# Patient Record
Sex: Female | Born: 1995 | Race: Black or African American | Hispanic: No | Marital: Single | Smoking: Never smoker
Health system: Southern US, Community
[De-identification: ages and names within clinical notes are randomized; demographics above are authoritative.]

## PROBLEM LIST (undated history)

## (undated) HISTORY — PX: TUBAL LIGATION: SHX77

---
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
## 2014-02-04 ENCOUNTER — Ambulatory Visit: Payer: Medicaid Other | Admitting: Family Medicine

## 2015-06-22 ENCOUNTER — Observation Stay (HOSPITAL_COMMUNITY)
Admission: EM | Admit: 2015-06-22 | Discharge: 2015-06-23 | Disposition: A | Discharge status: Home / Self Care | Payer: Medicaid Other | Attending: Surgery | Admitting: Surgery

## 2015-06-22 ENCOUNTER — Encounter (HOSPITAL_COMMUNITY)
Admission: EM | Disposition: A | Discharge status: Home / Self Care | Payer: Self-pay | Source: Home / Self Care | Attending: Emergency Medicine

## 2015-06-22 ENCOUNTER — Emergency Department (HOSPITAL_COMMUNITY): Payer: Medicaid Other

## 2015-06-22 ENCOUNTER — Emergency Department (HOSPITAL_COMMUNITY): Payer: Medicaid Other | Admitting: Anesthesiology

## 2015-06-22 ENCOUNTER — Encounter (HOSPITAL_COMMUNITY): Payer: Self-pay | Admitting: *Deleted

## 2015-06-22 DIAGNOSIS — K353 Acute appendicitis with localized peritonitis, without perforation or gangrene: Secondary | ICD-10-CM

## 2015-06-22 DIAGNOSIS — K3580 Unspecified acute appendicitis: Principal | ICD-10-CM | POA: Insufficient documentation

## 2015-06-22 DIAGNOSIS — Z880 Allergy status to penicillin: Secondary | ICD-10-CM | POA: Insufficient documentation

## 2015-06-22 DIAGNOSIS — N739 Female pelvic inflammatory disease, unspecified: Secondary | ICD-10-CM | POA: Insufficient documentation

## 2015-06-22 HISTORY — PX: LAPAROSCOPIC APPENDECTOMY: SHX408

## 2015-06-22 LAB — COMPREHENSIVE METABOLIC PANEL
ALBUMIN: 3.6 g/dL (ref 3.5–5.0)
ALT: 16 U/L (ref 14–54)
AST: 20 U/L (ref 15–41)
Alkaline Phosphatase: 53 U/L (ref 38–126)
Anion gap: 8 (ref 5–15)
BUN: 7 mg/dL (ref 6–20)
CHLORIDE: 106 mmol/L (ref 101–111)
CO2: 25 mmol/L (ref 22–32)
Calcium: 9.3 mg/dL (ref 8.9–10.3)
Creatinine, Ser: 0.9 mg/dL (ref 0.44–1.00)
GFR calc Af Amer: >60 mL/min (ref >60)
GFR calc non Af Amer: >60 mL/min (ref >60)
GLUCOSE: 102 mg/dL — AB (ref 65–99)
POTASSIUM: 4 mmol/L (ref 3.5–5.1)
Sodium: 139 mmol/L (ref 135–145)
Total Bilirubin: 0.4 mg/dL (ref 0.3–1.2)
Total Protein: 7.6 g/dL (ref 6.5–8.1)

## 2015-06-22 LAB — CBC
HEMATOCRIT: 44.6 % (ref 36.0–46.0)
Hemoglobin: 15.1 g/dL — ABNORMAL HIGH (ref 12.0–15.0)
MCH: 30.5 pg (ref 26.0–34.0)
MCHC: 33.9 g/dL (ref 30.0–36.0)
MCV: 90.1 fL (ref 78.0–100.0)
Platelets: 277 10*3/uL (ref 150–400)
RBC: 4.95 MIL/uL (ref 3.87–5.11)
RDW: 12.6 % (ref 11.5–15.5)
WBC: 11.1 10*3/uL — ABNORMAL HIGH (ref 4.0–10.5)

## 2015-06-22 LAB — WET PREP, GENITAL
Clue Cells Wet Prep HPF POC: NONE SEEN
Trich, Wet Prep: NONE SEEN
YEAST WET PREP: NONE SEEN

## 2015-06-22 LAB — URINALYSIS, ROUTINE W REFLEX MICROSCOPIC
BILIRUBIN URINE: NEGATIVE
GLUCOSE, UA: NEGATIVE mg/dL
Ketones, ur: NEGATIVE mg/dL
Nitrite: NEGATIVE
PH: 6.5 (ref 5.0–8.0)
Protein, ur: NEGATIVE mg/dL
Urobilinogen, UA: 1 mg/dL (ref 0.0–1.0)

## 2015-06-22 LAB — URINE MICROSCOPIC-ADD ON

## 2015-06-22 LAB — LIPASE, BLOOD: LIPASE: 21 U/L — AB (ref 22–51)

## 2015-06-22 LAB — HCG, QUANTITATIVE, PREGNANCY: hCG, Beta Chain, Quant, S: 1 m[IU]/mL (ref <5)

## 2015-06-22 SURGERY — APPENDECTOMY, LAPAROSCOPIC
Anesthesia: General

## 2015-06-22 MED ORDER — ZOLPIDEM TARTRATE 5 MG PO TABS: 5.0000 mg | ORAL_TABLET | Freq: Every evening | ORAL | Status: DC | PRN

## 2015-06-22 MED ORDER — BUPIVACAINE-EPINEPHRINE 0.5% -1:200000 IJ SOLN
INTRAMUSCULAR | Status: DC | PRN
  Administered 2015-06-22: 10 mL

## 2015-06-22 MED ORDER — ACETAMINOPHEN 325 MG PO TABS: 650.0000 mg | ORAL_TABLET | Freq: Four times a day (QID) | ORAL | Status: DC | PRN

## 2015-06-22 MED ORDER — CIPROFLOXACIN IN D5W 400 MG/200ML IV SOLN
400.0000 mg | Freq: Once | INTRAVENOUS | Status: AC
  Administered 2015-06-22: 400 mg via INTRAVENOUS
  Filled 2015-06-22: qty 200

## 2015-06-22 MED ORDER — SCOPOLAMINE 1 MG/3DAYS TD PT72
MEDICATED_PATCH | TRANSDERMAL | Status: DC | PRN
  Administered 2015-06-22: 1 via TRANSDERMAL

## 2015-06-22 MED ORDER — MIDAZOLAM HCL 2 MG/2ML IJ SOLN
INTRAMUSCULAR | Status: AC
  Filled 2015-06-22: qty 4

## 2015-06-22 MED ORDER — LIDOCAINE HCL (CARDIAC) 20 MG/ML IV SOLN
INTRAVENOUS | Status: AC
  Filled 2015-06-22: qty 5

## 2015-06-22 MED ORDER — KETOROLAC TROMETHAMINE 30 MG/ML IJ SOLN
30.0000 mg | Freq: Four times a day (QID) | INTRAMUSCULAR | Status: DC
  Administered 2015-06-22 – 2015-06-23 (×2): 30 mg via INTRAVENOUS
  Filled 2015-06-22 (×2): qty 1

## 2015-06-22 MED ORDER — DIPHENHYDRAMINE HCL 12.5 MG/5ML PO ELIX
12.5000 mg | ORAL_SOLUTION | Freq: Four times a day (QID) | ORAL | Status: DC | PRN
  Administered 2015-06-23: 12.5 mg via ORAL
  Filled 2015-06-22: qty 10

## 2015-06-22 MED ORDER — NEOSTIGMINE METHYLSULFATE 10 MG/10ML IV SOLN
INTRAVENOUS | Status: DC | PRN
  Administered 2015-06-22: 2 mg via INTRAVENOUS

## 2015-06-22 MED ORDER — SODIUM CHLORIDE 0.9 % IR SOLN
Status: DC | PRN
  Administered 2015-06-22: 1000 mL

## 2015-06-22 MED ORDER — NEOSTIGMINE METHYLSULFATE 10 MG/10ML IV SOLN
INTRAVENOUS | Status: AC
  Filled 2015-06-22: qty 1

## 2015-06-22 MED ORDER — DIPHENHYDRAMINE HCL 50 MG/ML IJ SOLN
25.0000 mg | Freq: Once | INTRAMUSCULAR | Status: AC
  Administered 2015-06-22: 25 mg via INTRAVENOUS
  Filled 2015-06-22: qty 1

## 2015-06-22 MED ORDER — ENOXAPARIN SODIUM 40 MG/0.4ML ~~LOC~~ SOLN: 40.0000 mg | SUBCUTANEOUS | Status: DC

## 2015-06-22 MED ORDER — ONDANSETRON HCL 4 MG/2ML IJ SOLN: 4.0000 mg | Freq: Four times a day (QID) | INTRAMUSCULAR | Status: DC | PRN

## 2015-06-22 MED ORDER — GLYCOPYRROLATE 0.2 MG/ML IJ SOLN
INTRAMUSCULAR | Status: AC
  Filled 2015-06-22: qty 2

## 2015-06-22 MED ORDER — SIMETHICONE 80 MG PO CHEW: 40.0000 mg | CHEWABLE_TABLET | Freq: Four times a day (QID) | ORAL | Status: DC | PRN

## 2015-06-22 MED ORDER — LIDOCAINE HCL (CARDIAC) 20 MG/ML IV SOLN
INTRAVENOUS | Status: DC | PRN
  Administered 2015-06-22: 50 mg via INTRAVENOUS

## 2015-06-22 MED ORDER — DEXAMETHASONE SODIUM PHOSPHATE 4 MG/ML IJ SOLN
INTRAMUSCULAR | Status: DC | PRN
  Administered 2015-06-22: 4 mg via INTRAVENOUS

## 2015-06-22 MED ORDER — HYDROMORPHONE HCL 1 MG/ML IJ SOLN
INTRAMUSCULAR | Status: AC
  Filled 2015-06-22: qty 1

## 2015-06-22 MED ORDER — MORPHINE SULFATE (PF) 2 MG/ML IV SOLN
2.0000 mg | Freq: Once | INTRAVENOUS | Status: AC
  Administered 2015-06-22: 2 mg via INTRAVENOUS
  Filled 2015-06-22: qty 1

## 2015-06-22 MED ORDER — SUCCINYLCHOLINE CHLORIDE 20 MG/ML IJ SOLN
INTRAMUSCULAR | Status: AC
  Filled 2015-06-22: qty 1

## 2015-06-22 MED ORDER — PROMETHAZINE HCL 25 MG/ML IJ SOLN
6.2500 mg | INTRAMUSCULAR | Status: DC | PRN
Start: 2015-06-22 — End: 2015-06-22

## 2015-06-22 MED ORDER — ACETAMINOPHEN 650 MG RE SUPP: 650.0000 mg | Freq: Four times a day (QID) | RECTAL | Status: DC | PRN

## 2015-06-22 MED ORDER — DEXTROSE-NACL 5-0.9 % IV SOLN
INTRAVENOUS | Status: DC
  Administered 2015-06-22: 23:00:00 via INTRAVENOUS

## 2015-06-22 MED ORDER — ONDANSETRON HCL 4 MG/2ML IJ SOLN
4.0000 mg | Freq: Once | INTRAMUSCULAR | Status: AC
  Administered 2015-06-22: 4 mg via INTRAVENOUS
  Filled 2015-06-22: qty 2

## 2015-06-22 MED ORDER — SCOPOLAMINE 1 MG/3DAYS TD PT72
MEDICATED_PATCH | TRANSDERMAL | Status: AC
  Filled 2015-06-22: qty 1

## 2015-06-22 MED ORDER — CIPROFLOXACIN IN D5W 400 MG/200ML IV SOLN
400.0000 mg | Freq: Two times a day (BID) | INTRAVENOUS | Status: DC
  Filled 2015-06-22 (×2): qty 200

## 2015-06-22 MED ORDER — FENTANYL CITRATE (PF) 250 MCG/5ML IJ SOLN
INTRAMUSCULAR | Status: DC | PRN
  Administered 2015-06-22: 100 ug via INTRAVENOUS
  Administered 2015-06-22: 150 ug via INTRAVENOUS

## 2015-06-22 MED ORDER — PROPOFOL 10 MG/ML IV BOLUS
INTRAVENOUS | Status: DC | PRN
  Administered 2015-06-22: 200 mg via INTRAVENOUS

## 2015-06-22 MED ORDER — CIPROFLOXACIN IN D5W 400 MG/200ML IV SOLN
400.0000 mg | Freq: Two times a day (BID) | INTRAVENOUS | Status: DC
  Administered 2015-06-23: 400 mg via INTRAVENOUS
  Filled 2015-06-22 (×2): qty 200

## 2015-06-22 MED ORDER — HYDRALAZINE HCL 20 MG/ML IJ SOLN: 10.0000 mg | INTRAMUSCULAR | Status: DC | PRN

## 2015-06-22 MED ORDER — HYDROMORPHONE HCL 1 MG/ML IJ SOLN
1.0000 mg | INTRAMUSCULAR | Status: DC | PRN
  Administered 2015-06-22 – 2015-06-23 (×2): 1 mg via INTRAVENOUS
  Filled 2015-06-22 (×2): qty 1

## 2015-06-22 MED ORDER — SUCCINYLCHOLINE CHLORIDE 20 MG/ML IJ SOLN
INTRAMUSCULAR | Status: DC | PRN
  Administered 2015-06-22: 120 mg via INTRAVENOUS

## 2015-06-22 MED ORDER — DIPHENHYDRAMINE HCL 50 MG/ML IJ SOLN: 12.5000 mg | Freq: Four times a day (QID) | INTRAMUSCULAR | Status: DC | PRN

## 2015-06-22 MED ORDER — SODIUM CHLORIDE 0.9 % IV BOLUS (SEPSIS)
1000.0000 mL | Freq: Once | INTRAVENOUS | Status: AC
  Administered 2015-06-22: 1000 mL via INTRAVENOUS

## 2015-06-22 MED ORDER — 0.9 % SODIUM CHLORIDE (POUR BTL) OPTIME
TOPICAL | Status: DC | PRN
  Administered 2015-06-22: 1000 mL

## 2015-06-22 MED ORDER — KETOROLAC TROMETHAMINE 30 MG/ML IJ SOLN: 30.0000 mg | Freq: Four times a day (QID) | INTRAMUSCULAR | Status: DC | PRN

## 2015-06-22 MED ORDER — ROCURONIUM BROMIDE 100 MG/10ML IV SOLN
INTRAVENOUS | Status: DC | PRN
  Administered 2015-06-22: 15 mg via INTRAVENOUS
  Administered 2015-06-22 (×2): 10 mg via INTRAVENOUS

## 2015-06-22 MED ORDER — MORPHINE SULFATE (PF) 4 MG/ML IV SOLN: 4.0000 mg | Freq: Once | INTRAVENOUS | Status: DC

## 2015-06-22 MED ORDER — ROCURONIUM BROMIDE 50 MG/5ML IV SOLN
INTRAVENOUS | Status: AC
  Filled 2015-06-22: qty 1

## 2015-06-22 MED ORDER — DEXAMETHASONE SODIUM PHOSPHATE 4 MG/ML IJ SOLN
INTRAMUSCULAR | Status: AC
  Filled 2015-06-22: qty 1

## 2015-06-22 MED ORDER — ONDANSETRON HCL 4 MG/2ML IJ SOLN
INTRAMUSCULAR | Status: DC | PRN
  Administered 2015-06-22: 4 mg via INTRAVENOUS

## 2015-06-22 MED ORDER — HYDROMORPHONE HCL 1 MG/ML IJ SOLN
0.2500 mg | INTRAMUSCULAR | Status: DC | PRN
  Administered 2015-06-22: 0.5 mg via INTRAVENOUS

## 2015-06-22 MED ORDER — GLYCOPYRROLATE 0.2 MG/ML IJ SOLN
INTRAMUSCULAR | Status: DC | PRN
  Administered 2015-06-22: 0.4 mg via INTRAVENOUS

## 2015-06-22 MED ORDER — PROPOFOL 10 MG/ML IV BOLUS
INTRAVENOUS | Status: AC
  Filled 2015-06-22: qty 20

## 2015-06-22 MED ORDER — BUPIVACAINE-EPINEPHRINE (PF) 0.5% -1:200000 IJ SOLN
INTRAMUSCULAR | Status: AC
  Filled 2015-06-22: qty 30

## 2015-06-22 MED ORDER — HYDROMORPHONE HCL 1 MG/ML IJ SOLN
1.0000 mg | Freq: Once | INTRAMUSCULAR | Status: AC
  Administered 2015-06-22: 1 mg via INTRAVENOUS
  Filled 2015-06-22: qty 1

## 2015-06-22 MED ORDER — LACTATED RINGERS IV SOLN
INTRAVENOUS | Status: DC | PRN
  Administered 2015-06-22 (×2): via INTRAVENOUS

## 2015-06-22 MED ORDER — METRONIDAZOLE IN NACL 5-0.79 MG/ML-% IV SOLN
500.0000 mg | Freq: Three times a day (TID) | INTRAVENOUS | Status: DC
  Administered 2015-06-22 – 2015-06-23 (×2): 500 mg via INTRAVENOUS
  Filled 2015-06-22 (×3): qty 100

## 2015-06-22 MED ORDER — OXYCODONE HCL 5 MG PO TABS
5.0000 mg | ORAL_TABLET | ORAL | Status: DC | PRN
  Administered 2015-06-23: 5 mg via ORAL
  Filled 2015-06-22: qty 1

## 2015-06-22 MED ORDER — MIDAZOLAM HCL 2 MG/2ML IJ SOLN
INTRAMUSCULAR | Status: DC | PRN
  Administered 2015-06-22: 2 mg via INTRAVENOUS

## 2015-06-22 MED ORDER — IOHEXOL 300 MG/ML  SOLN
100.0000 mL | Freq: Once | INTRAMUSCULAR | Status: AC | PRN
  Administered 2015-06-22: 100 mL via INTRAVENOUS

## 2015-06-22 MED ORDER — ONDANSETRON 4 MG PO TBDP: 4.0000 mg | ORAL_TABLET | Freq: Four times a day (QID) | ORAL | Status: DC | PRN

## 2015-06-22 MED ORDER — METRONIDAZOLE IN NACL 5-0.79 MG/ML-% IV SOLN
500.0000 mg | Freq: Once | INTRAVENOUS | Status: AC
  Administered 2015-06-22: 500 mg via INTRAVENOUS
  Filled 2015-06-22 (×2): qty 100

## 2015-06-22 MED ORDER — FENTANYL CITRATE (PF) 250 MCG/5ML IJ SOLN
INTRAMUSCULAR | Status: AC
  Filled 2015-06-22: qty 5

## 2015-06-22 MED ORDER — ONDANSETRON HCL 4 MG/2ML IJ SOLN
INTRAMUSCULAR | Status: AC
  Filled 2015-06-22: qty 2

## 2015-06-22 SURGICAL SUPPLY — 37 items
APPLIER CLIP ROT 10 11.4 M/L (STAPLE)
BLADE SURG ROTATE 9660 (MISCELLANEOUS) IMPLANT
CANISTER SUCTION 2500CC (MISCELLANEOUS) ×3 IMPLANT
CHLORAPREP W/TINT 26ML (MISCELLANEOUS) ×3 IMPLANT
CLIP APPLIE ROT 10 11.4 M/L (STAPLE) IMPLANT
COVER SURGICAL LIGHT HANDLE (MISCELLANEOUS) ×3 IMPLANT
CUTTER FLEX LINEAR 45M (STAPLE) ×3 IMPLANT
DRAPE WARM FLUID 44X44 (DRAPE) ×3 IMPLANT
ELECT REM PT RETURN 9FT ADLT (ELECTROSURGICAL) ×3
ELECTRODE REM PT RTRN 9FT ADLT (ELECTROSURGICAL) ×1 IMPLANT
ENDOLOOP SUT PDS II  0 18 (SUTURE)
ENDOLOOP SUT PDS II 0 18 (SUTURE) IMPLANT
GLOVE BIO SURGEON STRL SZ8 (GLOVE) ×3 IMPLANT
GLOVE BIOGEL PI IND STRL 8 (GLOVE) ×1 IMPLANT
GLOVE BIOGEL PI INDICATOR 8 (GLOVE) ×2
GOWN STRL REUS W/ TWL LRG LVL3 (GOWN DISPOSABLE) ×2 IMPLANT
GOWN STRL REUS W/ TWL XL LVL3 (GOWN DISPOSABLE) ×1 IMPLANT
GOWN STRL REUS W/TWL LRG LVL3 (GOWN DISPOSABLE) ×4
GOWN STRL REUS W/TWL XL LVL3 (GOWN DISPOSABLE) ×2
KIT BASIN OR (CUSTOM PROCEDURE TRAY) ×3 IMPLANT
KIT ROOM TURNOVER OR (KITS) ×3 IMPLANT
LIQUID BAND (GAUZE/BANDAGES/DRESSINGS) ×3 IMPLANT
NS IRRIG 1000ML POUR BTL (IV SOLUTION) ×3 IMPLANT
PAD ARMBOARD 7.5X6 YLW CONV (MISCELLANEOUS) ×6 IMPLANT
POUCH SPECIMEN RETRIEVAL 10MM (ENDOMECHANICALS) ×3 IMPLANT
RELOAD STAPLE TA45 3.5 REG BLU (ENDOMECHANICALS) ×3 IMPLANT
SCALPEL HARMONIC ACE (MISCELLANEOUS) ×3 IMPLANT
SET IRRIG TUBING LAPAROSCOPIC (IRRIGATION / IRRIGATOR) ×3 IMPLANT
SPECIMEN JAR SMALL (MISCELLANEOUS) ×3 IMPLANT
SUT MON AB 4-0 PC3 18 (SUTURE) ×3 IMPLANT
TOWEL OR 17X24 6PK STRL BLUE (TOWEL DISPOSABLE) ×3 IMPLANT
TOWEL OR 17X26 10 PK STRL BLUE (TOWEL DISPOSABLE) ×3 IMPLANT
TRAY FOLEY CATH 16FR SILVER (SET/KITS/TRAYS/PACK) ×3 IMPLANT
TRAY LAPAROSCOPIC MC (CUSTOM PROCEDURE TRAY) ×3 IMPLANT
TROCAR XCEL BLADELESS 5X75MML (TROCAR) ×6 IMPLANT
TROCAR XCEL BLUNT TIP 100MML (ENDOMECHANICALS) ×3 IMPLANT
TUBING INSUFFLATION (TUBING) ×3 IMPLANT

## 2015-06-22 NOTE — Anesthesia Postprocedure Evaluation (Signed)
Anesthesia Post Note  Patient: Tamara Perkins  Procedure(s) Performed: Procedure(s) (LRB): APPENDECTOMY LAPAROSCOPIC (N/A)  Anesthesia type: general  Patient location: PACU  Post pain: Pain level controlled  Post assessment: Patient's Cardiovascular Status Stable  Last Vitals:  Filed Vitals:   06/22/15 1954  BP: 125/61  Pulse: 64  Temp: 36.7 C  Resp: 14    Post vital signs: Reviewed and stable  Level of consciousness: sedated  Complications: No apparent anesthesia complications

## 2015-06-22 NOTE — Anesthesia Procedure Notes (Signed)
Procedure Name: Intubation Date/Time: 06/22/2015 5:02 PM Performed by: Alanda Amass A Pre-anesthesia Checklist: Patient identified, Emergency Drugs available, Suction available, Patient being monitored and Timeout performed Patient Re-evaluated:Patient Re-evaluated prior to inductionOxygen Delivery Method: Circle system utilized Preoxygenation: Pre-oxygenation with 100% oxygen Intubation Type: IV induction, Rapid sequence and Cricoid Pressure applied Laryngoscope Size: Mac and 3 Grade View: Grade I Tube type: Oral Tube size: 7.5 mm Number of attempts: 1 Airway Equipment and Method: Stylet Placement Confirmation: ETT inserted through vocal cords under direct vision,  positive ETCO2 and breath sounds checked- equal and bilateral Secured at: 21 cm Tube secured with: Tape Dental Injury: Teeth and Oropharynx as per pre-operative assessment

## 2015-06-22 NOTE — ED Notes (Addendum)
Pt states pain to R side of abdomen since this am.  Pain increases with palpation.  Also c/o pain with urination.  Pt also c/o recent vaginal bleeding, though she has a morena.

## 2015-06-22 NOTE — ED Provider Notes (Signed)
CSN: [id number]     Arrival date & time 06/22/15  1029 History   First MD Initiated Contact with Patient 06/22/15 1117     Chief Complaint  Patient presents with  . Flank Pain  . Vaginal Pain     (Consider location/radiation/quality/duration/timing/severity/associated sxs/prior Treatment) HPI   Patient is a 19 year old female, G1 P1001, otherwise healthy, who presents to the emergency room with 2 days of right-sided abdominal pain, that is located from her right lower quadrant to right upper quadrant, without radiation, is constant rated 10 out of 10, and pain is exacerbated with breathing, walking, bumped some cars, and specifically worse with urinating.  The pain began in her central low abdomen as a sharp shooting pain, but now is constantly located on the right side. She endorses mild nausea when she arrived in the ER, but denies vomiting, diarrhea, constipation, dyspepsia, acid reflux, fever, chills, sweats, loss of appetite. She is having dysuria, and is having to stop multiple times for urinary changes to pain, but she denies hematuria, urinary frequency-states only piece twice a day, and denies urinary urgency. Over the past week she has had intermittent vaginal bleeding, which is unusual for her because she is in the Mirena for 4 years.  She has one sexual partner, she uses condoms, requests STD testing, but does not have a known exposure.  She denies any vaginal discharge, vaginal pain, vaginal sores or dyspareunia.  She denies any chest pain, shortness of breath, dizziness or syncope    History reviewed. No pertinent past medical history. History reviewed. No pertinent past surgical history. No family history on file. Social History  Substance Use Topics  . Smoking status: Never Smoker   . Smokeless tobacco: None  . Alcohol Use: Yes     Comment: occ   OB History    No data available     Review of Systems  Constitutional: Negative.   HENT: Negative.   Eyes: Negative.    Respiratory: Negative.   Cardiovascular: Negative.   Gastrointestinal: Negative for vomiting, diarrhea, constipation, blood in stool, anal bleeding and rectal pain.  Endocrine: Negative.   Genitourinary: Positive for dysuria and vaginal bleeding. Negative for urgency, frequency, hematuria, flank pain, decreased urine volume, vaginal discharge, enuresis, genital sores, vaginal pain, menstrual problem, pelvic pain and dyspareunia.  Skin: Negative.   Neurological: Negative.   Hematological: Negative.   Psychiatric/Behavioral: Negative.       Allergies  Penicillins  Home Medications   Prior to Admission medications   Medication Sig Start Date End Date Taking? Authorizing Provider  levonorgestrel (MIRENA) 20 MCG/24HR IUD 1 each by Intrauterine route once.   Yes Historical Provider, MD   BP 118/60 mmHg  Pulse 56  Temp(Src) 98.1 F (36.7 C) (Oral)  Resp 16  Ht  (1.676 m)  SpO2 98% Physical Exam  Constitutional: She is oriented to person, place, and time. She appears well-developed and well-nourished. No distress.  Patient is well-developed well-nourished female, appears stated age, nontoxic appearing, appears to be somewhat uncomfortable  HENT:  Head: Normocephalic and atraumatic.  Nose: Nose normal.  Mouth/Throat: Oropharynx is clear and moist. No oropharyngeal exudate.  Eyes: Conjunctivae and EOM are normal. Pupils are equal, round, and reactive to light. Right eye exhibits no discharge. Left eye exhibits no discharge. No scleral icterus.  Neck: Normal range of motion. No JVD present. No tracheal deviation present. No thyromegaly present.  Cardiovascular: Normal rate, regular rhythm, normal heart sounds and intact distal pulses.  Exam reveals  no gallop and no friction rub.   No murmur heard. Pulmonary/Chest: Effort normal and breath sounds normal. No respiratory distress. She has no wheezes. She has no rales. She exhibits no tenderness.  Abdominal: Soft. Normal appearance  and bowel sounds are normal. She exhibits no mass. There is tenderness. There is guarding. There is no rebound and no CVA tenderness.  Abdomen soft, non-distended, bowel sounds 4, tenderness to light palpation in the right upper quadrant to right lower quadrant, mild tenderness to palpation suprapubic, nontender periumbilically, epigastric and left upper and left lower quadrant.  Negative Rovsing, ttp in RUQ, ttp at McBurney's point, no CVA tenderness  Genitourinary: Uterus normal. Uterus is not tender. Cervix exhibits discharge. Cervix exhibits no motion tenderness and no friability. Right adnexum displays no mass, no tenderness and no fullness. Left adnexum displays no mass, no tenderness and no fullness. There is bleeding in the vagina. No signs of injury around the vagina.  External genitalia, presence of 2 healing lesions in pubic hair, no active drainage, tender  Dark bloody discharge from cervix, presence of clotted blood in vaginal vault, strings from IUD visible coming in cervix, no purulent discharge  Musculoskeletal: Normal range of motion. She exhibits no edema or tenderness.  Lymphadenopathy:    She has no cervical adenopathy.  Neurological: She is alert and oriented to person, place, and time. She has normal reflexes. No cranial nerve deficit. She exhibits normal muscle tone. Coordination normal.  Skin: Skin is warm and dry. No rash noted. She is not diaphoretic. No cyanosis or erythema. No pallor. Nails show no clubbing.  Psychiatric: She has a normal mood and affect. Her behavior is normal. Judgment and thought content normal.  Nursing note and vitals reviewed.      ED Course  Procedures (including critical care time) Labs Review Labs Reviewed  WET PREP, GENITAL - Abnormal; Notable for the following:    WBC, Wet Prep HPF POC MODERATE (*)    All other components within normal limits  LIPASE, BLOOD - Abnormal; Notable for the following:    Lipase 21 (*)    All other  components within normal limits  COMPREHENSIVE METABOLIC PANEL - Abnormal; Notable for the following:    Glucose, Bld 102 (*)    All other components within normal limits  CBC - Abnormal; Notable for the following:    WBC 11.1 (*)    Hemoglobin 15.1 (*)    All other components within normal limits  URINALYSIS, ROUTINE W REFLEX MICROSCOPIC (NOT AT [facility]) - Abnormal; Notable for the following:    Specific Gravity, Urine >1.046 (*)    Hgb urine dipstick LARGE (*)    Leukocytes, UA SMALL (*)    All other components within normal limits  URINE MICROSCOPIC-ADD ON - Abnormal; Notable for the following:    Squamous Epithelial / LPF FEW (*)    All other components within normal limits  HCG, QUANTITATIVE, PREGNANCY  RPR  HIV ANTIBODY (ROUTINE TESTING)  GC/CHLAMYDIA PROBE AMP ([place]) NOT AT [facility]    Imaging Review Ct Abdomen Pelvis W Contrast  06/22/2015   CLINICAL DATA:  New right-sided pain mid to lower region around to the back. No nausea, vomiting, diarrhea, or fever. All  EXAM: CT ABDOMEN AND PELVIS WITH CONTRAST  TECHNIQUE: Multidetector CT imaging of the abdomen and pelvis was performed using the standard protocol following bolus administration of intravenous contrast.  CONTRAST:  OMNIPAQUE IOHEXOL 300 MG/ML  SOLN  COMPARISON:  None.  FINDINGS: Lower  chest: No pulmonary nodules, pleural effusions, or infiltrates. Heart size is normal. No imaged pericardial effusion or significant coronary artery calcifications.  Upper abdomen: No focal abnormality identified within the liver, spleen, pancreas, adrenal glands, or kidneys. The gallbladder is present.  Gastrointestinal tract: The stomach and small bowel loops are normal in appearance. There is a small amount of stranding surrounding the appendix suspicious for acute appendicitis. Appendix is upper limits normal in thickness. There is no associated abscess. No perforation. Colonic loops are normal in appearance.  Pelvis: The uterus is  present and contains intrauterine device in the central canal. A left ovarian cyst is 1.9 x 3.4 cm. The right ovary has a normal appearance. Within the right cul-de-sac region there is a focal collection measuring fluid attenuation. This measures 2.4 x 2.5 cm.  Retroperitoneum: No evidence for aortic aneurysm. No retroperitoneal or mesenteric adenopathy.  Abdominal wall: Unremarkable.  Osseous structures: Unremarkable.  IMPRESSION: 1. Small amount of fluid and stranding surrounding the appendix which is upper limits normal in size. Findings are suspicious for a early acute appendicitis. No evidence for perforation or abscess. 2. Left ovarian cyst 3.4 cm. Given the size and benign features, and no specific follow-up is felt to be necessary for this abnormality. 3. Small focal collection of fluid within the right cul-de-sac is 2.4 x 2.5 cm and may be related to inflammation of the appendix. This appears discrete from the ovary.   Electronically Signed   By: Norva Pavlov M.D.   On: 06/22/2015 13:42   I have personally reviewed and evaluated these images and lab results as part of my medical decision-making.   EKG Interpretation None      MDM   Final diagnoses:  None    Pt with right sided abdominal pain from RUQ to RLQ, mild suprapubic tenderness, and no CVA tenderness.  Pt's vaginal exam pertinent for vaginal bleeding, no CMT or adnexal tenderness.  She does have some dysuria symptoms, urine is not yet collected this time.  Her history is somewhat suspicious for acute appendicitis, she states that she had some low central abdominal pain at the onset and now is right sided with guarding, however she is febrile, not tachycardic, has not had any nausea or vomiting or loss of appetite, she states the pain is not worsened or relieved with eating.    Will need to rule out pyelonephritis with urinalysis, rule out acute appendicitis with CT scan  Patient is well-appearing, vital signs are stable and  within normal limits, however exam is pertinent for significant focal, right-sided abdominal pain with some peritoneal signs  CT pertinent for early acute appendicitis, labs pertinent for mild leukocytosis of 11.1, surgery will be called.  Case was reviewed with Dr. Rolland Porter.  Last ate - yesterday Last drank - drinking water when first brought back to ER room at 11:40 Allergic to penicillin  Dr. Luisa Hart was called for acute appendicitis - will be taken to the OR for appendectomy.    Danelle Berry, PA-C 06/22/15 1555  Rolland Porter, MD 07/01/15 [phone]

## 2015-06-22 NOTE — Transfer of Care (Signed)
Immediate Anesthesia Transfer of Care Note  Patient: Tamara Perkins  Procedure(s) Performed: Procedure(s): APPENDECTOMY LAPAROSCOPIC (N/A)  Patient Location: PACU  Anesthesia Type:General  Level of Consciousness: awake  Airway & Oxygen Therapy: Patient Spontanous Breathing and Patient connected to nasal cannula oxygen  Post-op Assessment: Report given to RN and Post -op Vital signs reviewed and stable  Post vital signs: Reviewed and stable  Last Vitals:  Filed Vitals:   06/22/15 1818  BP: 107/59  Pulse: 68  Temp: 37 C  Resp: 11    Complications: No apparent anesthesia complications

## 2015-06-22 NOTE — Op Note (Signed)
Appendectomy, Lap, Procedure Note  Indications: The patient presented with a history of right-sided abdominal pain. A CT revealed findings consistent with acute appendicitis.The procedure has been discussed with the patient.  Alternative therapies have been discussed with the patient.  Operative risks include bleeding,  Infection,  Organ injury,  Nerve injury,  Blood vessel injury,  DVT,  Pulmonary embolism,  Death,  And possible reoperation.  Medical management risks include worsening of present situation.  The success of the procedure is 50 -90 % at treating patients symptoms.  The patient understands and agrees to proceed.  Pre-operative Diagnosis: Acute appendicitis without mention of peritonitis  Post-operative Diagnosis: Same plus PID without abscess  Surgeon: Sabrin Dunlevy A.   Assistants: none   Anesthesia: General endotracheal anesthesia and Local anesthesia 0.25.% bupivacaine, with epinephrine  ASA Class: 1  Procedure Details  The patient was seen again in the Holding Room. The risks, benefits, complications, treatment options, and expected outcomes were discussed with the patient and/or family. The possibilities of reaction to medication, pulmonary aspiration, perforation of viscus, bleeding, recurrent infection, finding a normal appendix, the need for additional procedures, failure to diagnose a condition, and creating a complication requiring transfusion or operation were discussed. There was concurrence with the proposed plan and informed consent was obtained. The site of surgery was properly noted/marked. The patient was taken to Operating Room, identified as [employer] and the procedure verified as Appendectomy. A Time Out was held and the above information confirmed.  The patient was placed in the supine position and general anesthesia was induced, along with placement of orogastric tube, Venodyne boots, and a Foley catheter. The abdomen was prepped and draped in a sterile  fashion. A one centimeter infraumbilical incision was made and the peritoneal cavity was accessed using the OPEN  technique. The pneumoperitoneum was then established to steady pressure of 12 mmHg. A 12 mm port was placed through the umbilical incision. Additional 5 mm cannulas then placed in the left lower quadrant of the abdomen and half way between the umbilicus and pubic symphysis under direct vision. A careful evaluation of the entire abdomen was carried out. The right fallopian tube was swollen and injected near the inflamed appendix.  Th The right ovary was normal.  The left fallopian tube was mildly swollen and right ovary normal.  The uterus was injected.  This is most consistent with mild PID.   The patient was placed in Trendelenburg and left lateral decubitus position. The small intestines were retracted in the cephalad and left lateral direction away from the pelvis and right lower quadrant. The patient was found to have an enlarged and inflamed appendix that was extending into the pelvis. There was no evidence of perforation.  The appendix was carefully dissected. A window was made in the mesoappendix at the base of the appendix. A harmonic scalpel was used across the mesoappendix. The appendix was divided at its base using an endo-GIA stapler. Minimal appendiceal stump was left in place. There was no evidence of bleeding, leakage, or complication after division of the appendix. Irrigation was also performed and irrigate suctioned from the abdomen as well.  The umbilical port site was closed using 0 vicryl pursestring sutures fashion at the level of the fascia. The trocar site skin wounds were closed using skin staples.  Instrument, sponge, and needle counts were correct at the conclusion of the case.   Findings: The appendix was found to be inflamed. There were not signs of necrosis.  There was not  perforation. There was not abscess formation.  Estimated Blood Loss:  Minimal                   Total IV Fluids: 700 mL         Specimens: APPENDIX         Complications:  None; patient tolerated the procedure well.         Disposition: PACU - hemodynamically stable.         Condition: stable

## 2015-06-22 NOTE — H&P (Signed)
Tamara Perkins is an 19 y.o. female.   Chief Complaint: abdominalpain HPI: 2 day history of right sided abdominal pain worsening over the last 24 hours.  Location right lower to mid abdomen.  No N/V and bowel function normal.  CT Shows appendicitis.  Pain is worse with movement and 7/10  History reviewed. No pertinent past medical history.  History reviewed. No pertinent past surgical history.  No family history on file. Social History:  reports that she has never smoked. She does not have any smokeless tobacco history on file. She reports that she drinks alcohol. She reports that she does not use illicit drugs.  Allergies:  Allergies  Allergen Reactions  . Penicillins Other (See Comments)    Childhood reaction     (Not in a hospital admission)  Results for orders placed or performed during the hospital encounter of 06/22/15 (from the past 48 hour(s))  Lipase, blood     Status: Abnormal   Collection Time: 06/22/15 11:21 AM  Result Value Ref Range   Lipase 21 (L) 22 - 51 U/L  Comprehensive metabolic panel     Status: Abnormal   Collection Time: 06/22/15 11:21 AM  Result Value Ref Range   Sodium 139 135 - 145 mmol/L   Potassium 4.0 3.5 - 5.1 mmol/L   Chloride 106 101 - 111 mmol/L   CO2 25 22 - 32 mmol/L   Glucose, Bld 102 (H) 65 - 99 mg/dL   BUN 7 6 - 20 mg/dL   Creatinine, Ser 0.90 0.44 - 1.00 mg/dL   Calcium 9.3 8.9 - 10.3 mg/dL   Total Protein 7.6 6.5 - 8.1 g/dL   Albumin 3.6 3.5 - 5.0 g/dL   AST 20 15 - 41 U/L   ALT 16 14 - 54 U/L   Alkaline Phosphatase 53 38 - 126 U/L   Total Bilirubin 0.4 0.3 - 1.2 mg/dL   GFR calc non Af Amer >60 >60 mL/min   GFR calc Af Amer >60 >60 mL/min    Comment: (NOTE) The eGFR has been calculated using the CKD EPI equation. This calculation has not been validated in all clinical situations. eGFR's persistently <60 mL/min signify possible Chronic Kidney Disease.    Anion gap 8 5 - 15  CBC     Status: Abnormal   Collection Time: 06/22/15  11:21 AM  Result Value Ref Range   WBC 11.1 (H) 4.0 - 10.5 K/uL   RBC 4.95 3.87 - 5.11 MIL/uL   Hemoglobin 15.1 (H) 12.0 - 15.0 g/dL   HCT 44.6 36.0 - 46.0 %   MCV 90.1 78.0 - 100.0 fL   MCH 30.5 26.0 - 34.0 pg   MCHC 33.9 30.0 - 36.0 g/dL   RDW 12.6 11.5 - 15.5 %   Platelets 277 150 - 400 K/uL  hCG, quantitative, pregnancy     Status: None   Collection Time: 06/22/15 11:21 AM  Result Value Ref Range   hCG, Beta Chain, Quant, S 1 <5 mIU/mL    Comment:          GEST. AGE      CONC.  (mIU/mL)   <=1 WEEK        5 - 50     2 WEEKS       50 - 500     3 WEEKS       100 - 10,000     4 WEEKS     1,000 - 30,000     5 WEEKS  3,500 - 115,000   6-8 WEEKS     12,000 - 270,000    12 WEEKS     15,000 - 220,000        FEMALE AND NON-PREGNANT FEMALE:     LESS THAN 5 mIU/mL   Wet prep, genital     Status: Abnormal   Collection Time: 06/22/15 12:11 PM  Result Value Ref Range   Yeast Wet Prep HPF POC NONE SEEN NONE SEEN   Trich, Wet Prep NONE SEEN NONE SEEN   Clue Cells Wet Prep HPF POC NONE SEEN NONE SEEN   WBC, Wet Prep HPF POC MODERATE (A) NONE SEEN  Urinalysis, Routine w reflex microscopic (not at [facility])     Status: Abnormal   Collection Time: 06/22/15  2:26 PM  Result Value Ref Range   Color, Urine YELLOW YELLOW   APPearance CLEAR CLEAR   Specific Gravity, Urine >1.046 (H) 1.005 - 1.030   pH 6.5 5.0 - 8.0   Glucose, UA NEGATIVE NEGATIVE mg/dL   Hgb urine dipstick LARGE (A) NEGATIVE   Bilirubin Urine NEGATIVE NEGATIVE   Ketones, ur NEGATIVE NEGATIVE mg/dL   Protein, ur NEGATIVE NEGATIVE mg/dL   Urobilinogen, UA 1.0 0.0 - 1.0 mg/dL   Nitrite NEGATIVE NEGATIVE   Leukocytes, UA SMALL (A) NEGATIVE  Urine microscopic-add on     Status: Abnormal   Collection Time: 06/22/15  2:26 PM  Result Value Ref Range   Squamous Epithelial / LPF FEW (A) RARE   WBC, UA 11-20 <3 WBC/hpf   RBC / HPF 7-10 <3 RBC/hpf   Bacteria, UA RARE RARE   Ct Abdomen Pelvis W Contrast  06/22/2015   CLINICAL  DATA:  New right-sided pain mid to lower region around to the back. No nausea, vomiting, diarrhea, or fever. All  EXAM: CT ABDOMEN AND PELVIS WITH CONTRAST  TECHNIQUE: Multidetector CT imaging of the abdomen and pelvis was performed using the standard protocol following bolus administration of intravenous contrast.  CONTRAST:  159m OMNIPAQUE IOHEXOL 300 MG/ML  SOLN  COMPARISON:  None.  FINDINGS: Lower chest: No pulmonary nodules, pleural effusions, or infiltrates. Heart size is normal. No imaged pericardial effusion or significant coronary artery calcifications.  Upper abdomen: No focal abnormality identified within the liver, spleen, pancreas, adrenal glands, or kidneys. The gallbladder is present.  Gastrointestinal tract: The stomach and small bowel loops are normal in appearance. There is a small amount of stranding surrounding the appendix suspicious for acute appendicitis. Appendix is upper limits normal in thickness. There is no associated abscess. No perforation. Colonic loops are normal in appearance.  Pelvis: The uterus is present and contains intrauterine device in the central canal. A left ovarian cyst is 1.9 x 3.4 cm. The right ovary has a normal appearance. Within the right cul-de-sac region there is a focal collection measuring fluid attenuation. This measures 2.4 x 2.5 cm.  Retroperitoneum: No evidence for aortic aneurysm. No retroperitoneal or mesenteric adenopathy.  Abdominal wall: Unremarkable.  Osseous structures: Unremarkable.  IMPRESSION: 1. Small amount of fluid and stranding surrounding the appendix which is upper limits normal in size. Findings are suspicious for a early acute appendicitis. No evidence for perforation or abscess. 2. Left ovarian cyst 3.4 cm. Given the size and benign features, and no specific follow-up is felt to be necessary for this abnormality. 3. Small focal collection of fluid within the right cul-de-sac is 2.4 x 2.5 cm and may be related to inflammation of the  appendix. This appears discrete from the  ovary.   Electronically Signed   By: Nolon Nations M.D.   On: 06/22/2015 13:42    Review of Systems  Constitutional: Positive for diaphoresis. Negative for chills.  HENT: Negative.   Eyes: Negative.   Respiratory: Negative.   Cardiovascular: Negative.   Gastrointestinal: Positive for abdominal pain. Negative for heartburn and blood in stool.  Genitourinary: Negative.   Musculoskeletal: Negative.   Skin: Negative.   Neurological: Positive for dizziness.  Psychiatric/Behavioral: Negative.     Blood pressure 118/52, pulse 91, temperature 97.7 F (36.5 C), temperature source Oral, resp. rate 16, height _0  (1.676 m), SpO2 96 %. Physical Exam  Constitutional: She is oriented to person, place, and time. She appears well-developed and well-nourished.  HENT:  Head: Normocephalic and atraumatic.  Eyes: Pupils are equal, round, and reactive to light. No scleral icterus.  Neck: Normal range of motion. Neck supple.  Cardiovascular: Normal rate and regular rhythm.   Respiratory: Effort normal and breath sounds normal.  GI: There is tenderness in the right lower quadrant. There is guarding and tenderness at McBurney's point. There is no rigidity.  Neurological: She is alert and oriented to person, place, and time.  Skin: Skin is warm and dry.  Psychiatric: She has a normal mood and affect. Her behavior is normal. Judgment and thought content normal.     Assessment/Plan Acute appendicitis  Discussed medical and surgical therapy Discussed the pro and cons of each and complications of each Discussed laparoscopic appendectomy  The procedure has been discussed with the patient.  Alternative therapies have been discussed with the patient.  Operative risks include bleeding,  Infection,  Organ injury,  Nerve injury,  Blood vessel injury,  DVT,  Pulmonary embolism,  Death,  And possible reoperation.  Medical management risks include worsening of present  situation.  The success of the procedure is 50 -90 % at treating patients symptoms.  The patient understands and agrees to proceed.  Keyera Hattabaugh A. 06/22/2015, 3:13 PM

## 2015-06-22 NOTE — Anesthesia Preprocedure Evaluation (Addendum)
Anesthesia Evaluation  Patient identified by MRN, date of birth, ID band Patient awake    Reviewed: Allergy & Precautions, H&P , NPO status , Patient's Chart, lab work & pertinent test results  Airway Mallampati: I  TM Distance: >3 FB Neck ROM: Full    Dental  (+) Teeth Intact, Dental Advisory Given   Pulmonary neg pulmonary ROS,    Pulmonary exam normal       Cardiovascular negative cardio ROS Normal cardiovascular exam    Neuro/Psych negative neurological ROS  negative psych ROS   GI/Hepatic negative GI ROS, Neg liver ROS,   Endo/Other  negative endocrine ROS  Renal/GU negative Renal ROS     Musculoskeletal   Abdominal   Peds  Hematology   Anesthesia Other Findings   Reproductive/Obstetrics negative OB ROS                           Anesthesia Physical Anesthesia Plan  ASA: II and emergent  Anesthesia Plan: General ETT   Post-op Pain Management:    Induction: Intravenous, Rapid sequence and Cricoid pressure planned  Airway Management Planned: Oral ETT  Additional Equipment:   Intra-op Plan:   Post-operative Plan: Extubation in OR  Informed Consent: I have reviewed the patients History and Physical, chart, labs and discussed the procedure including the risks, benefits and alternatives for the proposed anesthesia with the patient or authorized representative who has indicated his/her understanding and acceptance.   Dental advisory given  Plan Discussed with: CRNA, Anesthesiologist and Surgeon  Anesthesia Plan Comments:        Anesthesia Quick Evaluation

## 2015-06-23 LAB — RPR: RPR: NONREACTIVE

## 2015-06-23 LAB — BASIC METABOLIC PANEL
ANION GAP: 6 (ref 5–15)
BUN: <5 mg/dL — ABNORMAL LOW (ref 6–20)
CALCIUM: 8.8 mg/dL — AB (ref 8.9–10.3)
CO2: 25 mmol/L (ref 22–32)
Chloride: 104 mmol/L (ref 101–111)
Creatinine, Ser: 0.79 mg/dL (ref 0.44–1.00)
Glucose, Bld: 118 mg/dL — ABNORMAL HIGH (ref 65–99)
Potassium: 3.9 mmol/L (ref 3.5–5.1)
Sodium: 135 mmol/L (ref 135–145)

## 2015-06-23 LAB — CBC
HCT: 41.7 % (ref 36.0–46.0)
HEMOGLOBIN: 13.9 g/dL (ref 12.0–15.0)
MCH: 30.5 pg (ref 26.0–34.0)
MCHC: 33.3 g/dL (ref 30.0–36.0)
MCV: 91.4 fL (ref 78.0–100.0)
Platelets: 286 10*3/uL (ref 150–400)
RBC: 4.56 MIL/uL (ref 3.87–5.11)
RDW: 12.7 % (ref 11.5–15.5)
WBC: 15 10*3/uL — ABNORMAL HIGH (ref 4.0–10.5)

## 2015-06-23 LAB — HIV ANTIBODY (ROUTINE TESTING W REFLEX): HIV SCREEN 4TH GENERATION: NONREACTIVE

## 2015-06-23 MED ORDER — DOXYCYCLINE HYCLATE 100 MG PO CAPS: 100.0000 mg | ORAL_CAPSULE | Freq: Every day | ORAL | Status: DC

## 2015-06-23 MED ORDER — OXYCODONE HCL 5 MG PO TABS: 5.0000 mg | ORAL_TABLET | ORAL | Status: DC | PRN

## 2015-06-23 MED ORDER — INFLUENZA VAC SPLIT QUAD 0.5 ML IM SUSY: 0.5000 mL | PREFILLED_SYRINGE | INTRAMUSCULAR | Status: DC

## 2015-06-23 NOTE — Discharge Instructions (Signed)
[facility] ______[facility] °LAPAROSCOPIC SURGERY: POST OP INSTRUCTIONS °Always review your discharge instruction sheet given to you by the facility where your surgery was performed. °IF YOU HAVE DISABILITY OR FAMILY LEAVE FORMS, YOU MUST BRING THEM TO THE OFFICE FOR PROCESSING.   °DO NOT GIVE THEM TO YOUR DOCTOR. ° °1. A prescription for pain medication may be given to you upon discharge.  Take your pain medication as prescribed, if needed.  If narcotic pain medicine is not needed, then you may take acetaminophen (Tylenol) or ibuprofen (Advil) as needed. °2. Take your usually prescribed medications unless otherwise directed. °3. If you need a refill on your pain medication, please contact your pharmacy.  They will contact our office to request authorization. Prescriptions will not be filled after 5pm or on week-ends. °4. You should follow a light diet the first few days after arrival home, such as soup and crackers, etc.  Be sure to include lots of fluids daily. °5. Most patients will experience some swelling and bruising in the area of the incisions.  Ice packs will help.  Swelling and bruising can take several days to resolve.  °6. It is common to experience some constipation if taking pain medication after surgery.  Increasing fluid intake and taking a stool softener (such as Colace) will usually help or prevent this problem from occurring.  A mild laxative (Milk of Magnesia or Miralax) should be taken according to package instructions if there are no bowel movements after 48 hours. °7. Unless discharge instructions indicate otherwise, you may remove your bandages 24-48 hours after surgery, and you may shower at that time.  You may have steri-strips (small skin tapes) in place directly over the incision.  These strips should be left on the skin for 7-10 days.  If your surgeon used skin glue on the incision, you may shower in 24 hours.  The glue will flake off over the next 2-3 weeks.  Any sutures or  staples will be removed at the office during your follow-up visit. °8. ACTIVITIES:  You may resume regular (light) daily activities beginning the next day--such as daily self-care, walking, climbing stairs--gradually increasing activities as tolerated.  You may have sexual intercourse when it is comfortable.  Refrain from any heavy lifting or straining until approved by your doctor. °a. You may drive when you are no longer taking prescription pain medication, you can comfortably wear a seatbelt, and you can safely maneuver your car and apply brakes. °b. RETURN TO WORK:  __________________________________________________________ °9. You should see your doctor in the office for a follow-up appointment approximately 2-3 weeks after your surgery.  Make sure that you call for this appointment within a day or two after you arrive home to insure a convenient appointment time. °10. OTHER INSTRUCTIONS: __________________________________________________________________________________________________________________________ __________________________________________________________________________________________________________________________ °WHEN TO CALL YOUR DOCTOR: °1. Fever over 101.0 °2. Inability to urinate °3. Continued bleeding from incision. °4. Increased pain, redness, or drainage from the incision. °5. Increasing abdominal pain ° °The clinic staff is available to answer your questions during regular business hours.  Please don’t hesitate to call and ask to speak to one of the nurses for clinical concerns.  If you have a medical emergency, go to the nearest emergency room or call 911.  A surgeon from [facility] is always on call at the hospital. °[address], [address], [place], [place]  [address] ? [address] [address], [place], [place]   [address] °[phone] ? [phone] ? FAX [phone] °Web site:   [email or web address] °

## 2015-06-23 NOTE — Discharge Summary (Signed)
Physician Discharge Summary  Patient ID: Tamara Perkins MRN: [id number] DOB/AGE: December 05, 1995 19 y.o.  Admit date: 06/22/2015 Discharge date: 06/23/2015  Admission Diagnoses:  Acute appendicitis  Discharge Diagnoses:  Same with early PID (per Dr. Luisa Hart)  Active Problems:   * No active hospital problems. *   Surgery:  Lap appendectomy  Discharged Condition: improved  Hospital Course:   Had surgery.  Did well.  Ready for discharge with Percocet for pain and doxycycline for exposure to PID-100 mg QD for 14 days  Consults: none  Significant Diagnostic Studies: path pending    Discharge Exam: Blood pressure 114/61, pulse 50, temperature 97.9 F (36.6 C), temperature source Oral, resp. rate 19, height  (1.676 m), SpO2 100 %. Incisions ok. Pain controlled  Disposition: Final discharge disposition not confirmed  Discharge Instructions    Diet - low sodium heart healthy    Complete by:  As directed      Discharge instructions    Complete by:  As directed   May shower when home Take doxycycline for 14 days     Increase activity slowly    Complete by:  As directed             Medication List    TAKE these medications        doxycycline 100 MG capsule  Commonly known as:  VIBRAMYCIN  Take 1 capsule (100 mg total) by mouth daily.     levonorgestrel 20 MCG/24HR IUD  Commonly known as:  MIRENA  1 each by Intrauterine route once.     oxyCODONE 5 MG immediate release tablet  Commonly known as:  Oxy IR/ROXICODONE  Take 1-2 tablets (5-10 mg total) by mouth every 4 (four) hours as needed for moderate pain.           Follow-up Information    Follow up with [facility] In 3 weeks.   Specialty:  General Surgery   Contact information:   [address] [place] [address] [phone]      Signed: Valarie Merino 06/23/2015, 8:53 AM

## 2015-06-23 NOTE — Progress Notes (Signed)
Tamara Perkins to be D/C'd Home per MD order.  Discussed with the patient and all questions fully answered.  VSS, Skin clean, dry and intact without evidence of skin break down, no evidence of skin tears noted. IV catheter discontinued intact. Site without signs and symptoms of complications. Dressing and pressure applied.  An After Visit Summary was printed and given to the patient. Patient received prescription.  D/c education completed with patient/family including follow up instructions, medication list, d/c activities limitations if indicated, with other d/c instructions as indicated by MD - patient able to verbalize understanding, all questions fully answered.   Patient instructed to return to ED, call 911, or call MD for any changes in condition.   Patient escorted via WC, and D/C home via private auto.  Tamara Perkins 06/23/2015 1155

## 2015-06-24 ENCOUNTER — Encounter (HOSPITAL_COMMUNITY): Payer: Self-pay | Admitting: Surgery

## 2015-06-24 LAB — GC/CHLAMYDIA PROBE AMP (~~LOC~~) NOT AT ARMC
Chlamydia: POSITIVE — AB
Neisseria Gonorrhea: NEGATIVE

## 2015-06-25 ENCOUNTER — Telehealth (HOSPITAL_COMMUNITY): Payer: Self-pay | Admitting: Emergency Medicine

## 2015-06-25 NOTE — Telephone Encounter (Signed)
Positive Chlamydia culture Chart sent to EDP for review 

## 2015-06-26 ENCOUNTER — Telehealth (HOSPITAL_BASED_OUTPATIENT_CLINIC_OR_DEPARTMENT_OTHER): Payer: Self-pay | Admitting: *Deleted

## 2015-09-22 ENCOUNTER — Encounter: Payer: Medicaid Other | Admitting: Obstetrics & Gynecology

## 2015-11-20 ENCOUNTER — Ambulatory Visit (INDEPENDENT_AMBULATORY_CARE_PROVIDER_SITE_OTHER): Payer: Medicaid Other | Admitting: Obstetrics and Gynecology

## 2015-11-20 ENCOUNTER — Other Ambulatory Visit (HOSPITAL_COMMUNITY)
Admission: RE | Admit: 2015-11-20 | Discharge: 2015-11-20 | Disposition: A | Discharge status: Home / Self Care | Payer: Medicaid Other | Source: Ambulatory Visit | Attending: Obstetrics and Gynecology | Admitting: Obstetrics and Gynecology

## 2015-11-20 ENCOUNTER — Encounter: Payer: Self-pay | Admitting: Obstetrics and Gynecology

## 2015-11-20 VITALS — BP 126/67 | HR 74 | Temp 98.5°F | Ht 66.0 in | Wt 190.6 lb

## 2015-11-20 DIAGNOSIS — A749 Chlamydial infection, unspecified: Secondary | ICD-10-CM | POA: Diagnosis not present

## 2015-11-20 DIAGNOSIS — Z30432 Encounter for removal of intrauterine contraceptive device: Secondary | ICD-10-CM

## 2015-11-20 DIAGNOSIS — Z113 Encounter for screening for infections with a predominantly sexual mode of transmission: Secondary | ICD-10-CM

## 2015-11-20 MED ORDER — PRENATAL VITAMINS 0.8 MG PO TABS: 1.0000 | ORAL_TABLET | Freq: Every day | ORAL | Status: AC

## 2015-11-20 NOTE — Progress Notes (Addendum)
CLINIC ENCOUNTER NOTE  History:  20 y.o. G1P1001 here today for IUD removal.  IUD was placed 2013. Placed May of 2013. Wants removed because interested in becoming pregnancy. No history postpartum depression. Hx chlamydia last year treated for pid. No pelvic pain today.  History reviewed. No pertinent past medical history.  Past Surgical History  Procedure Laterality Date  . Laparoscopic appendectomy N/A 06/22/2015    Procedure: APPENDECTOMY LAPAROSCOPIC;  Surgeon: Harriette Bouillon, MD;  Location: [facility] OR;  Service: General;  Laterality: N/A;    The following portions of the patient's history were reviewed and updated as appropriate: allergies, current medications, past family history, past medical history, past social history, past surgical history and problem list.    Review of Systems:  See above; comprehensive review of systems was otherwise negative.  Objective:  Physical Exam BP 126/67 mmHg  Pulse 74  Temp(Src) 98.5 F (36.9 C)  Ht  (1.676 m)  Wt 190 lb 9.6 oz (86.456 kg)  BMI 30.78 kg/m2 CONSTITUTIONAL: Well-developed, well-nourished female in no acute distress.  HENT:  Normocephalic, atraumatic SKIN: Skin is warm and dry.  NEUROLGIC: Alert  PSYCHIATRIC: Normal mood and affect.  CARDIOVASCULAR: Normal heart rate noted RESPIRATORY: Effort and breath sounds normal, no problems with respiration noted ABDOMEN: Soft, no distention noted.  No tenderness, rebound or guarding.  SSE: normal vulva, vagina, cervix. iud strings observed, grasped with ringed forceps, and iud easily removed without bleeding.   Labs and Imaging No results found.  Assessment & Plan:   # IUD removal - uneventful removal today, return precautions discussed  # Preconception counseling - start prenatal vitamin, exercise/weight loss/dietary counseling provided  # Screening for STDs - gonorrhea, chlamydia, hiv, syphilis, and hep b screening today - chlamydia represents re-screening as chlamydia  positive last year  Routine preventative health maintenance measures emphasized.     Tamara Peeks B. Levi Klaiber, MD OB/GYN Fellow Center for [facility], [facility]   Addendum - chlamydia positive. Will treat with azithromycin. Nursing to inform. Patient doesn't appear to have prsented to our lab for hiv, rpr, and hep b surface antigen. Will have nursing encourage this. Will have nursing give appropriate sex and partner counseling. F/u 3-6 months for repeat test.

## 2015-11-21 LAB — GC/CHLAMYDIA PROBE AMP (~~LOC~~) NOT AT ARMC
CHLAMYDIA, DNA PROBE: POSITIVE — AB
Neisseria Gonorrhea: NEGATIVE

## 2015-11-23 DIAGNOSIS — A749 Chlamydial infection, unspecified: Secondary | ICD-10-CM | POA: Insufficient documentation

## 2015-11-23 MED ORDER — AZITHROMYCIN 500 MG PO TABS: 1000.0000 mg | ORAL_TABLET | Freq: Once | ORAL | Status: DC

## 2015-11-23 NOTE — Addendum Note (Signed)
Addended by: Shonna Chock B on: 11/23/2015 12:55 PM   Modules accepted: Orders

## 2015-11-24 ENCOUNTER — Telehealth: Payer: Self-pay | Admitting: General Practice

## 2015-11-24 NOTE — Telephone Encounter (Signed)
Per Dr Ashok Pall, patient was positive for chlamydia. Zithromax needs to be sent to pharmacy. Patient should abstain from intercourse for 2 weeks after partner is treated. Partner should be informed. Patient needs TOC in 3  Months. Called patient, no answer- left message stating we are trying to reach you with results, please call us back at the clinics. STD card completed

## 2015-11-25 NOTE — Telephone Encounter (Signed)
Called pt and left message to call us back and state the best telephone number to reach her. **Outgoing voice mail message had a different person's name.

## 2015-11-26 NOTE — Telephone Encounter (Signed)
Mychart message to patient asking her to call us for results.

## 2015-11-26 NOTE — Telephone Encounter (Signed)
Pt also needs to come in and have labs redrawn. Her blood was lost by [facility].

## 2015-12-01 ENCOUNTER — Other Ambulatory Visit: Payer: Medicaid Other

## 2015-12-02 ENCOUNTER — Encounter: Payer: Self-pay | Admitting: *Deleted

## 2015-12-02 LAB — MIDATLANTIC REGIONAL ALLERGY PANEL (DC,DE,MD,~~LOC~~,VA,WV)

## 2015-12-02 LAB — HIV ANTIBODY (ROUTINE TESTING W REFLEX)

## 2015-12-02 LAB — HEPATITIS B SURFACE ANTIGEN

## 2015-12-02 LAB — RPR

## 2015-12-02 NOTE — Telephone Encounter (Signed)
Called pt and spoke with her mother who states this is the only phone number for pt.  She stated that pt has been given our messages and attempted to call us back. She will give the message to Zyara again to call us back and leave a message on nurse voice mail. I stated that I will send a message to her MyChart account again and pt can respond to [place] that way.

## 2015-12-02 NOTE — Telephone Encounter (Signed)
Attempted to call patient again with no answer.  

## 2015-12-06 ENCOUNTER — Encounter: Payer: Self-pay | Admitting: *Deleted

## 2015-12-08 NOTE — Telephone Encounter (Signed)
Pt has not responded to any phone calls

## 2017-01-16 IMAGING — CT CT ABD-PELV W/ CM
2 of 5 series · 16 of 46 positions shown, 18 images · IV contrast (APPLIED)
Comparison: None.

CLINICAL DATA: New right-sided pain mid to lower region around to
the back. No nausea, vomiting, diarrhea, or fever. All

EXAM:
CT ABDOMEN AND PELVIS WITH CONTRAST
TECHNIQUE: Multidetector CT imaging of the abdomen and pelvis was performed
using the standard protocol following bolus administration of
intravenous contrast.
CONTRAST:  100mL OMNIPAQUE IOHEXOL 300 MG/ML  SOLN

[Series 2: abd/ pelvis 5.0 i30f 1 · axial · 0.66mm/px · z∈[+791,+1171]mm · 13 of 86 slices shown, 15 images]
[im 5/86  soft-tissue]
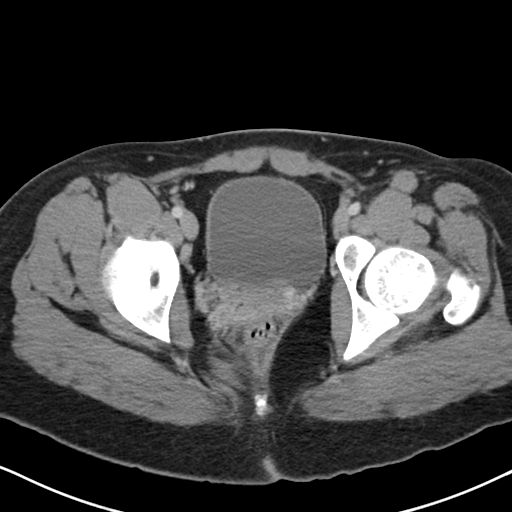
[im 5/86  bone]
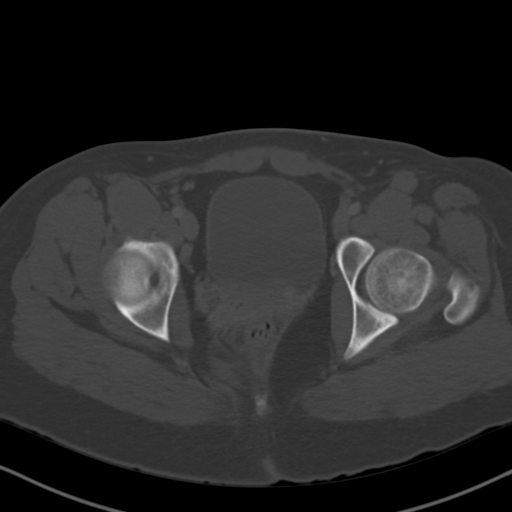
[im 10/86  soft-tissue]
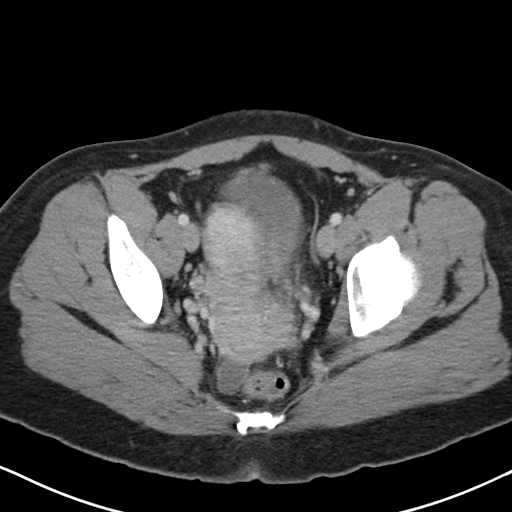
[im 19/86  soft-tissue]
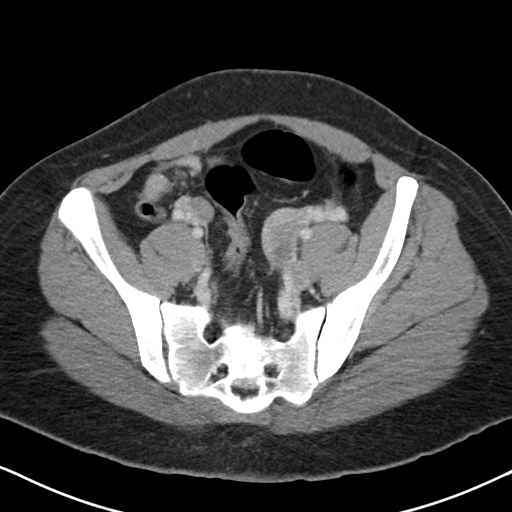
[im 24/86  soft-tissue]
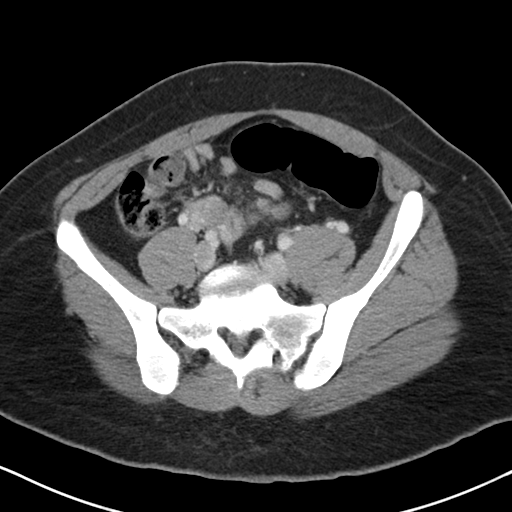
[im 29/86  soft-tissue]
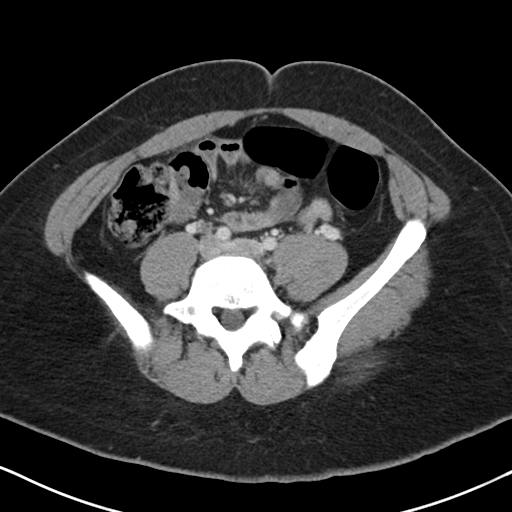
[im 38/86  soft-tissue]
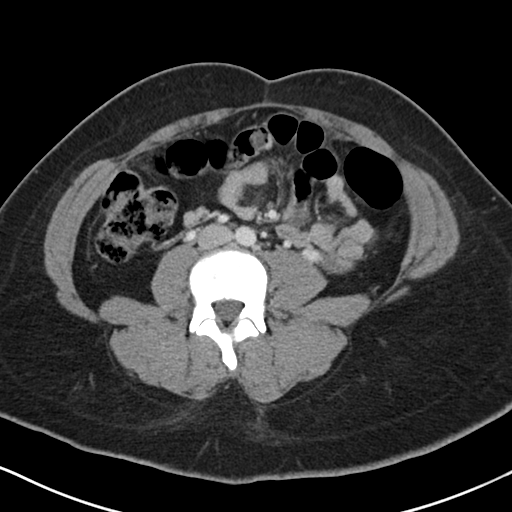
[im 43/86  soft-tissue]
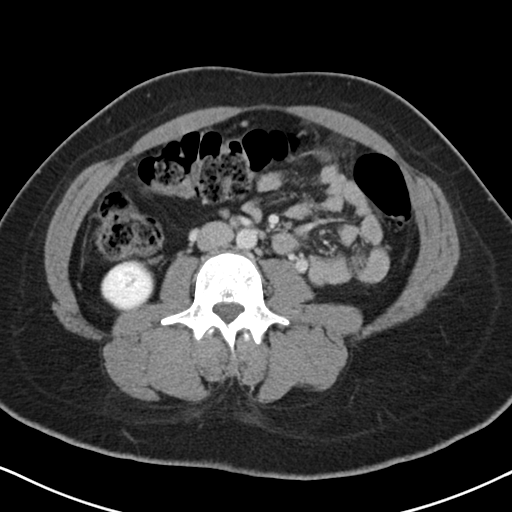
[im 48/86  soft-tissue]
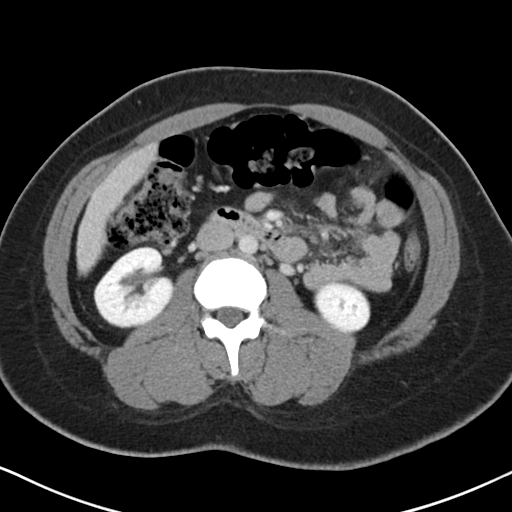
[im 57/86  soft-tissue]
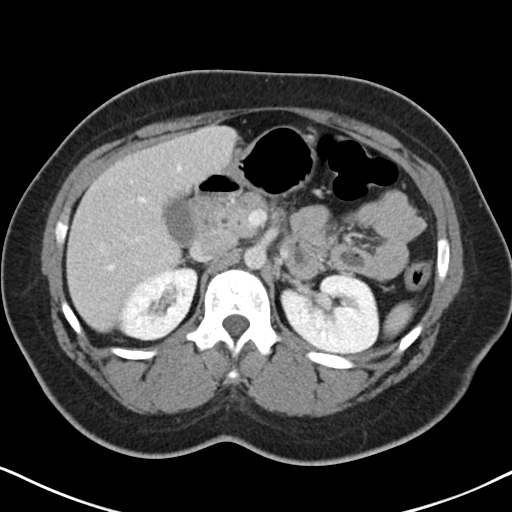
[im 57/86  bone]
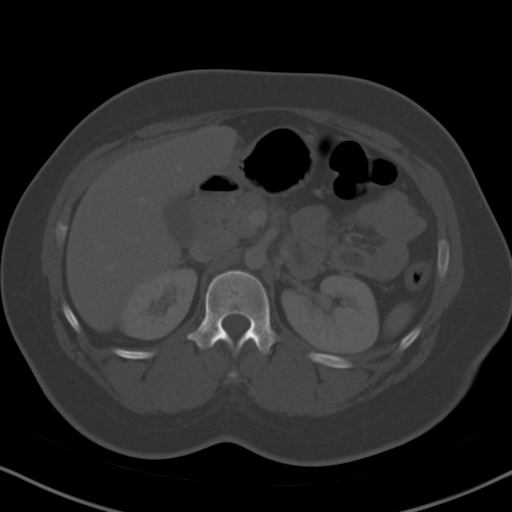
[im 62/86  soft-tissue]
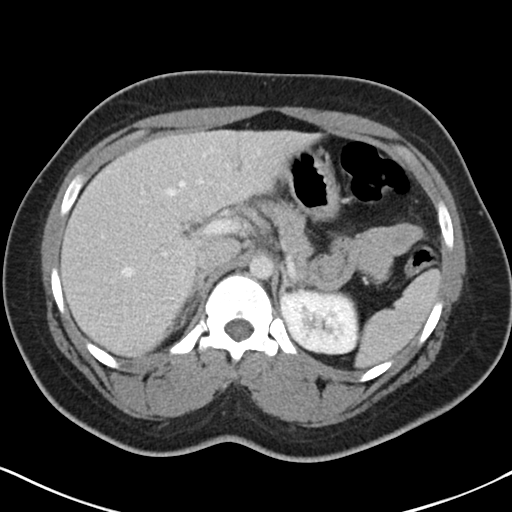
[im 67/86  soft-tissue]
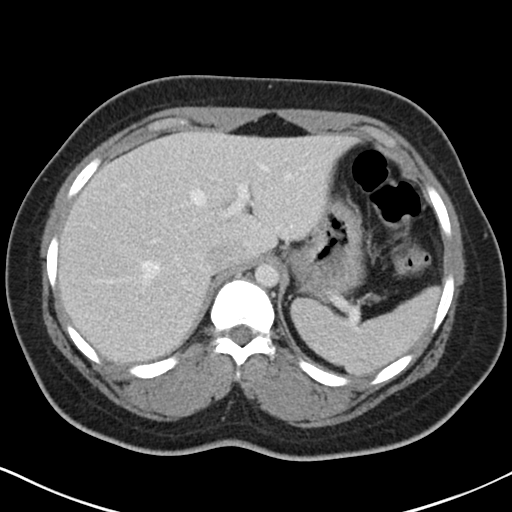
[im 76/86  soft-tissue]
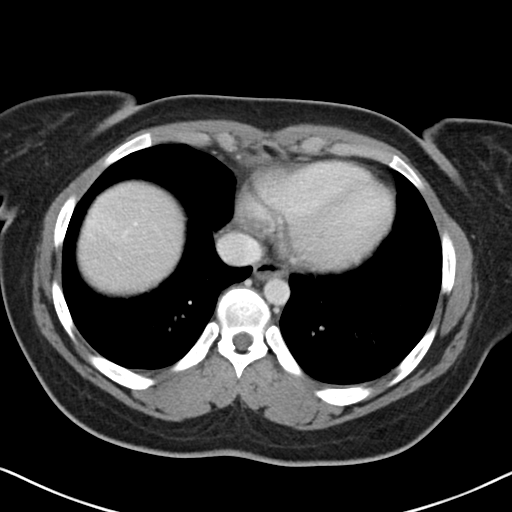
[im 81/86  soft-tissue]
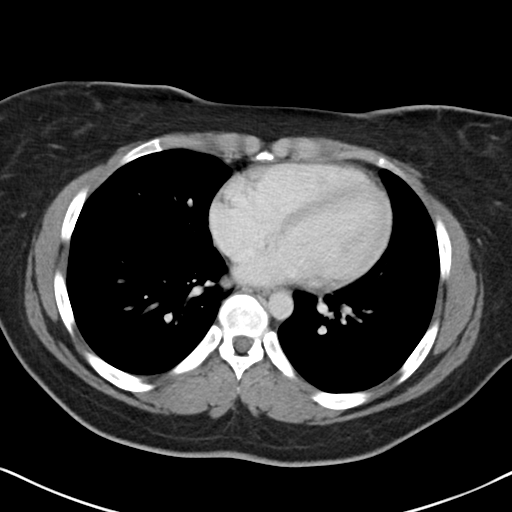

[Series 5: coronal soft tissue · coronal · 0.74mm/px · 3 of 83 slices shown]
[im 28/83  soft-tissue]
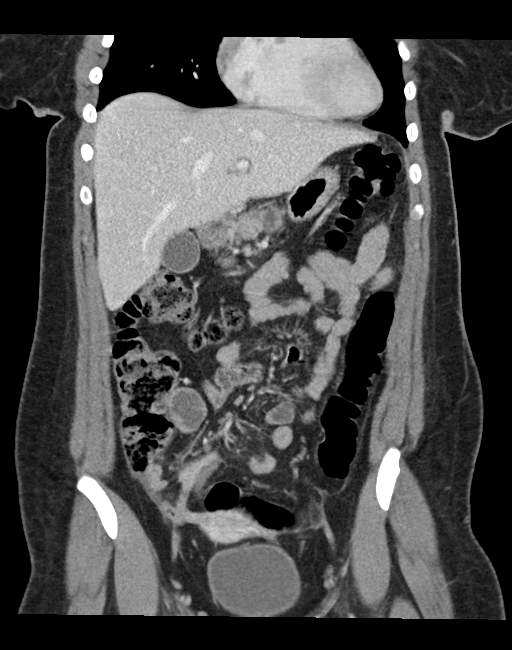
[im 37/83  soft-tissue]
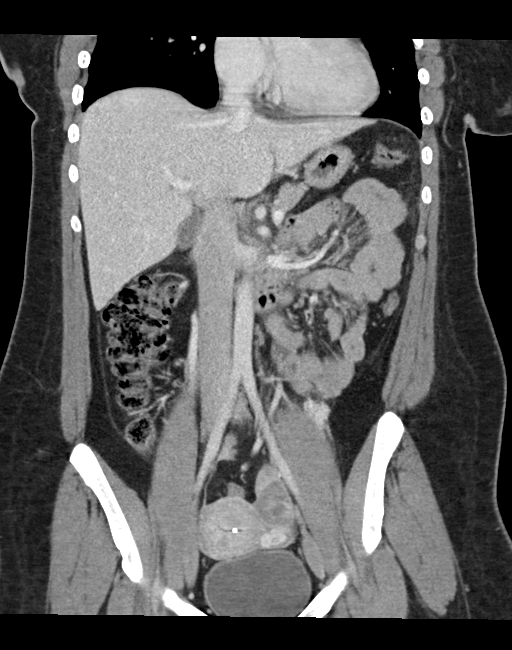
[im 46/83  soft-tissue]
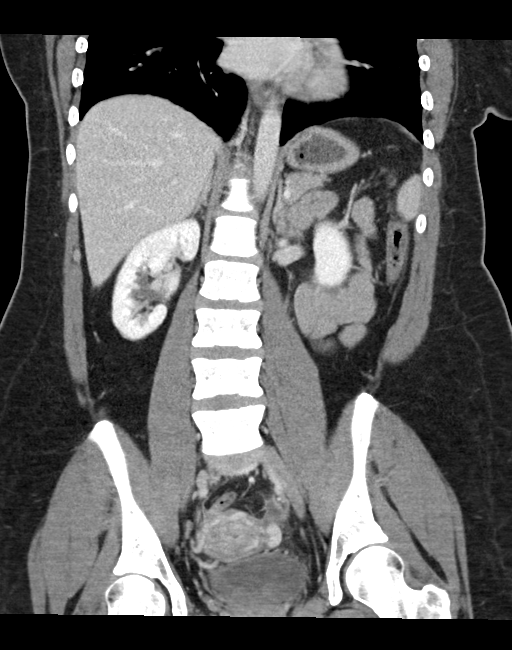

[16 of 46 positions shown; findings below may reference images not displayed]

FINDINGS: Lower chest: No pulmonary nodules, pleural effusions, or
infiltrates. Heart size is normal. No imaged pericardial effusion or
significant coronary artery calcifications.

Upper abdomen: No focal abnormality identified within the liver,
spleen, pancreas, adrenal glands, or kidneys. The gallbladder is
present.

Gastrointestinal tract: The stomach and small bowel loops are normal
in appearance. There is a small amount of stranding surrounding the
appendix suspicious for acute appendicitis. Appendix is upper limits
normal in thickness. There is no associated abscess. No perforation.
Colonic loops are normal in appearance.

Pelvis: The uterus is present and contains intrauterine device in
the central canal. A left ovarian cyst is 1.9 x 3.4 cm. The right
ovary has a normal appearance. Within the right cul-de-sac region
there is a focal collection measuring fluid attenuation. This
measures 2.4 x 2.5 cm.

Retroperitoneum: No evidence for aortic aneurysm. No retroperitoneal
or mesenteric adenopathy.

Abdominal wall: Unremarkable.

Osseous structures: Unremarkable.
IMPRESSION: 1. Small amount of fluid and stranding surrounding the appendix
which is upper limits normal in size. Findings are suspicious for a
early acute appendicitis. No evidence for perforation or abscess.
2. Left ovarian cyst 3.4 cm. Given the size and benign features, and
no specific follow-up is felt to be necessary for this abnormality.
3. Small focal collection of fluid within the right cul-de-sac is
2.4 x 2.5 cm and may be related to inflammation of the appendix.
This appears discrete from the ovary.

## 2017-12-13 ENCOUNTER — Emergency Department
Admission: EM | Admit: 2017-12-13 | Discharge: 2017-12-13 | Disposition: A | Discharge status: Home / Self Care | Payer: Medicaid Other | Attending: Emergency Medicine | Admitting: Emergency Medicine

## 2017-12-13 ENCOUNTER — Emergency Department: Payer: Medicaid Other

## 2017-12-13 ENCOUNTER — Encounter: Payer: Self-pay | Admitting: Intensive Care

## 2017-12-13 ENCOUNTER — Other Ambulatory Visit: Payer: Self-pay

## 2017-12-13 DIAGNOSIS — R05 Cough: Secondary | ICD-10-CM | POA: Diagnosis present

## 2017-12-13 DIAGNOSIS — R69 Illness, unspecified: Secondary | ICD-10-CM

## 2017-12-13 DIAGNOSIS — Z79899 Other long term (current) drug therapy: Secondary | ICD-10-CM | POA: Diagnosis not present

## 2017-12-13 DIAGNOSIS — J111 Influenza due to unidentified influenza virus with other respiratory manifestations: Secondary | ICD-10-CM | POA: Diagnosis not present

## 2017-12-13 LAB — INFLUENZA PANEL BY PCR (TYPE A & B)
INFLAPCR: NEGATIVE
INFLBPCR: NEGATIVE

## 2017-12-13 MED ORDER — PSEUDOEPH-BROMPHEN-DM 30-2-10 MG/5ML PO SYRP: 5.0000 mL | ORAL_SOLUTION | Freq: Four times a day (QID) | ORAL | 0 refills | Status: DC | PRN

## 2017-12-13 MED ORDER — LIDOCAINE VISCOUS 2 % MT SOLN: 10.0000 mL | OROMUCOSAL | 0 refills | Status: AC | PRN

## 2017-12-13 NOTE — ED Provider Notes (Signed)
[facility] Emergency Department Provider Note  ____________________________________________  Time seen: Approximately 9:37 AM  I have reviewed the triage vital signs and the nursing notes.   HISTORY  Chief Complaint Influenza    HPI Tamara Perkins is a 22 y.o. female presents emergency department for evaluation of  body aches, bilateral ear pain, nasal congestion, sore throat, nonproductive cough for 2 days.  She is concerned that she has the flu.  Patient does not smoke.  No fever, chills, nausea, vomiting, abdominal pain, diarrhea, constipation.   History reviewed. No pertinent past medical history.  Patient Active Problem List   Diagnosis Date Noted  . Chlamydia 11/23/2015  . Exposure to STD 09/18/2013  . Obesity, unspecified 09/18/2013    Past Surgical History:  Procedure Laterality Date  . LAPAROSCOPIC APPENDECTOMY N/A 06/22/2015   Procedure: APPENDECTOMY LAPAROSCOPIC;  Surgeon: Harriette Bouillonhomas Cornett, MD;  Location: [facility] OR;  Service: General;  Laterality: N/A;    Prior to Admission medications   Medication Sig Start Date End Date Taking? Authorizing Provider  azithromycin (ZITHROMAX) 500 MG tablet Take 2 tablets (1,000 mg total) by mouth once. 11/23/15   Wouk, Wilfred CurtisNoah Bedford, MD  brompheniramine-pseudoephedrine-DM 30-2-10 MG/5ML syrup Take 5 mLs by mouth 4 (four) times daily as needed. 12/13/17   Enid DerryWagner, Amarii Amy, PA-C  levonorgestrel (MIRENA) 20 MCG/24HR IUD 1 each by Intrauterine route once.    [provider]  lidocaine (XYLOCAINE) 2 % solution Use as directed 10 mLs in the mouth or throat as needed for mouth pain. 12/13/17   Enid DerryWagner, Osby Sweetin, PA-C  Prenatal Multivit-Min-Fe-FA (PRENATAL VITAMINS) 0.8 MG tablet Take 1 tablet by mouth daily. 11/20/15   Wouk, Wilfred CurtisNoah Bedford, MD    Allergies Penicillins  History reviewed. No pertinent family history.  Social History Social History   Tobacco Use  . Smoking status: Never Smoker  . Smokeless tobacco: Never  Used  Substance Use Topics  . Alcohol use: Yes    Comment: occ  . Drug use: No     Review of Systems  Constitutional: No fever/chills Eyes: No visual changes. No discharge. ENT: Positive for congestion and rhinorrhea.  Positive for the sore throat.  Respiratory: Positive for cough. No SOB. Gastrointestinal: No abdominal pain.  No nausea, no vomiting.  No diarrhea.  No constipation. Musculoskeletal: Positive for body aches. Skin: Negative for rash, abrasions, lacerations, ecchymosis. Neurological: Negative for headaches.   ____________________________________________   PHYSICAL EXAM:  VITAL SIGNS: ED Triage Vitals [12/13/17 0816]  Enc Vitals Group     BP (!) 131/52     Pulse Rate 78     Resp 16     Temp 98.6 F (37 C)     Temp Source Oral     SpO2 99 %     Weight 221 lb (100.2 kg)     Height 5\' 6"  (1.676 m)     Head Circumference      Peak Flow      Pain Score 10     Pain Loc      Pain Edu?      Excl. in GC?      Constitutional: Alert and oriented. Well appearing and in no acute distress. Eyes: Conjunctivae are normal. PERRL. EOMI. No discharge. Head: Atraumatic. ENT: No frontal and maxillary sinus tenderness.      Ears: Tympanic membranes pearly gray with good landmarks. No discharge.      Nose: Mild congestion/rhinnorhea.      Mouth/Throat: Mucous membranes are moist. Oropharynx non-erythematous. Tonsils  not enlarged. No exudates. Uvula midline. Neck: No stridor.   Hematological/Lymphatic/Immunilogical: No cervical lymphadenopathy. Cardiovascular: Normal rate, regular rhythm.  Good peripheral circulation. Respiratory: Normal respiratory effort without tachypnea or retractions. Lungs CTAB. Good air entry to the bases with no decreased or absent breath sounds. Gastrointestinal: Bowel sounds 4 quadrants. Soft and nontender to palpation. No guarding or rigidity. No palpable masses. No distention. Musculoskeletal: Full range of motion to all extremities. No gross  deformities appreciated. Neurologic:  Normal speech and language. No gross focal neurologic deficits are appreciated.  Skin:  Skin is warm, dry and intact. No rash noted.   ____________________________________________   LABS (all labs ordered are listed, but only abnormal results are displayed)  Labs Reviewed  INFLUENZA PANEL BY PCR (TYPE A & B)   ____________________________________________  EKG   ____________________________________________  RADIOLOGY Lexine Baton, personally viewed and evaluated these images (plain radiographs) as part of my medical decision making, as well as reviewing the written report by the radiologist.  Dg Chest 1 View  Result Date: 12/13/2017 CLINICAL DATA:  Generalized body pain.  Chest discomfort EXAM: CHEST 1 VIEW COMPARISON:  None FINDINGS: The heart size and mediastinal contours are within normal limits. Both lungs are clear. The visualized skeletal structures are unremarkable. IMPRESSION: No active disease. Electronically Signed   By: Signa Kell M.D.   On: 12/13/2017 08:56    ____________________________________________    PROCEDURES  Procedure(s) performed:    Procedures    Medications - No data to display   ____________________________________________   INITIAL IMPRESSION / ASSESSMENT AND PLAN / ED COURSE  Pertinent labs & imaging results that were available during my care of the patient were reviewed by me and considered in my medical decision making (see chart for details).  Review of the [place] CSRS was performed in accordance of the [facility] prior to dispensing any controlled drugs.     Patient's diagnosis is consistent with influenza like illness. Vital signs and exam are reassuring.  Influenza test is negative.  Chest x-ray is negative for acute cardia pulmonary processes.  Patient appears well and is staying well hydrated. Patient feels comfortable going home. Patient will be discharged home with prescriptions for  Bromfed, viscous lidocaine. Patient is to follow up with PCP as needed or otherwise directed. Patient is given ED precautions to return to the ED for any worsening or new symptoms.     ____________________________________________  FINAL CLINICAL IMPRESSION(S) / ED DIAGNOSES  Final diagnoses:  Influenza-like illness      NEW MEDICATIONS STARTED DURING THIS VISIT:  ED Discharge Orders        Ordered    brompheniramine-pseudoephedrine-DM 30-2-10 MG/5ML syrup  4 times daily PRN     12/13/17 1002    lidocaine (XYLOCAINE) 2 % solution  As needed     12/13/17 1002          This chart was dictated using voice recognition software/Dragon. Despite best efforts to proofread, errors can occur which can change the meaning. Any change was purely unintentional.    Enid Derry, PA-C 12/13/17 1014    Rockne Menghini, MD 12/13/17 1030

## 2017-12-13 NOTE — ED Triage Notes (Signed)
Patient c/o generalized body pain all over, chest discomfort when coughing, chills/sweats. No OTC meds taken today

## 2017-12-13 NOTE — ED Notes (Signed)
See triage note  States she developed body aches,cough,sore throat and bilateral ear pain this am  Afebrile on arrival

## 2017-12-31 ENCOUNTER — Other Ambulatory Visit: Payer: Self-pay

## 2017-12-31 ENCOUNTER — Emergency Department
Admission: EM | Admit: 2017-12-31 | Discharge: 2017-12-31 | Disposition: A | Discharge status: Home / Self Care | Payer: Medicaid Other | Attending: Emergency Medicine | Admitting: Emergency Medicine

## 2017-12-31 ENCOUNTER — Encounter: Payer: Self-pay | Admitting: Emergency Medicine

## 2017-12-31 DIAGNOSIS — A599 Trichomoniasis, unspecified: Secondary | ICD-10-CM | POA: Diagnosis not present

## 2017-12-31 DIAGNOSIS — N390 Urinary tract infection, site not specified: Secondary | ICD-10-CM | POA: Insufficient documentation

## 2017-12-31 DIAGNOSIS — Z79899 Other long term (current) drug therapy: Secondary | ICD-10-CM | POA: Insufficient documentation

## 2017-12-31 DIAGNOSIS — R300 Dysuria: Secondary | ICD-10-CM | POA: Diagnosis present

## 2017-12-31 LAB — URINALYSIS, COMPLETE (UACMP) WITH MICROSCOPIC
BACTERIA UA: NONE SEEN
BILIRUBIN URINE: NEGATIVE
Glucose, UA: NEGATIVE mg/dL
KETONES UR: NEGATIVE mg/dL
NITRITE: NEGATIVE
Protein, ur: 100 mg/dL — AB
Specific Gravity, Urine: 1.017 (ref 1.005–1.030)
pH: 6 (ref 5.0–8.0)

## 2017-12-31 LAB — WET PREP, GENITAL
Clue Cells Wet Prep HPF POC: NONE SEEN
Sperm: NONE SEEN
WBC, Wet Prep HPF POC: NONE SEEN
YEAST WET PREP: NONE SEEN

## 2017-12-31 LAB — CHLAMYDIA/NGC RT PCR (ARMC ONLY)
Chlamydia Tr: NOT DETECTED
N GONORRHOEAE: NOT DETECTED

## 2017-12-31 LAB — POCT PREGNANCY, URINE: PREG TEST UR: NEGATIVE

## 2017-12-31 MED ORDER — METRONIDAZOLE 500 MG PO TABS
2000.0000 mg | ORAL_TABLET | Freq: Once | ORAL | Status: AC
  Administered 2017-12-31: 2000 mg via ORAL
  Filled 2017-12-31: qty 4

## 2017-12-31 MED ORDER — SULFAMETHOXAZOLE-TRIMETHOPRIM 800-160 MG PO TABS: 1.0000 | ORAL_TABLET | Freq: Two times a day (BID) | ORAL | 0 refills | Status: DC

## 2017-12-31 NOTE — ED Notes (Signed)
Called lab to check on urinalysis results. Result will be ready in approx 10 mins.

## 2017-12-31 NOTE — ED Triage Notes (Signed)
Burning with urination x1 day.

## 2017-12-31 NOTE — ED Provider Notes (Signed)
[facility] Emergency Department Provider Note  ____________________________________________  Time seen: Approximately 11:10 AM  I have reviewed the triage vital signs and the nursing notes.   HISTORY  Chief Complaint Dysuria    HPI Tamara Perkins is a 22 y.o. female that presents emergency department for evaluation of white vaginal discharge and dysuria for 1 day.  She is currently on her menstrual cycle. No concern for STD.  She denies fever, chills, nausea, vomiting, abdominal pain, back pain, frequency, urgency.   History reviewed. No pertinent past medical history.  Patient Active Problem List   Diagnosis Date Noted  . Chlamydia 11/23/2015  . Exposure to STD 09/18/2013  . Obesity, unspecified 09/18/2013    Past Surgical History:  Procedure Laterality Date  . LAPAROSCOPIC APPENDECTOMY N/A 06/22/2015   Procedure: APPENDECTOMY LAPAROSCOPIC;  Surgeon: Harriette Bouillon, MD;  Location: [facility] OR;  Service: General;  Laterality: N/A;    Prior to Admission medications   Medication Sig Start Date End Date Taking? Authorizing Provider  azithromycin (ZITHROMAX) 500 MG tablet Take 2 tablets (1,000 mg total) by mouth once. 11/23/15   Wouk, Wilfred Curtis, MD  brompheniramine-pseudoephedrine-DM 30-2-10 MG/5ML syrup Take 5 mLs by mouth 4 (four) times daily as needed. 12/13/17   Enid Derry, PA-C  levonorgestrel (MIRENA) 20 MCG/24HR IUD 1 each by Intrauterine route once.    [provider]  lidocaine (XYLOCAINE) 2 % solution Use as directed 10 mLs in the mouth or throat as needed for mouth pain. 12/13/17   Enid Derry, PA-C  Prenatal Multivit-Min-Fe-FA (PRENATAL VITAMINS) 0.8 MG tablet Take 1 tablet by mouth daily. 11/20/15   Wouk, Wilfred Curtis, MD  sulfamethoxazole-trimethoprim (BACTRIM DS,SEPTRA DS) 800-160 MG tablet Take 1 tablet by mouth 2 (two) times daily. 12/31/17   Enid Derry, PA-C    Allergies Penicillins  No family history on file.  Social  History Social History   Tobacco Use  . Smoking status: Never Smoker  . Smokeless tobacco: Never Used  Substance Use Topics  . Alcohol use: Yes    Comment: occ  . Drug use: No     Review of Systems  Constitutional: No fever/chills Gastrointestinal: No abdominal pain.  No nausea, no vomiting.  Musculoskeletal: Negative for musculoskeletal pain. Skin: Negative for rash, abrasions, lacerations, ecchymosis.   ____________________________________________   PHYSICAL EXAM:  VITAL SIGNS: ED Triage Vitals  Enc Vitals Group     BP 12/31/17 0840 107/63     Pulse Rate 12/31/17 0840 78     Resp 12/31/17 0840 18     Temp 12/31/17 0840 98.2 F (36.8 C)     Temp src --      SpO2 12/31/17 0840 100 %     Weight 12/31/17 0841 218 lb (98.9 kg)     Height 12/31/17 0841 5\' 6"  (1.676 m)     Head Circumference --      Peak Flow --      Pain Score 12/31/17 0840 9     Pain Loc --      Pain Edu? --      Excl. in GC? --      Constitutional: Alert and oriented. Well appearing and in no acute distress. Eyes: Conjunctivae are normal. PERRL. EOMI. Head: Atraumatic. ENT:      Ears:      Nose: No congestion/rhinnorhea.      Mouth/Throat: Mucous membranes are moist.  Neck: No stridor.   Cardiovascular: Normal rate, regular rhythm.  Good peripheral circulation. Respiratory: Normal respiratory  effort without tachypnea or retractions. Lungs CTAB. Good air entry to the bases with no decreased or absent breath sounds. Gastrointestinal: Bowel sounds 4 quadrants. Soft and nontender to palpation. No guarding or rigidity. No palpable masses. No distention. No CVA tenderness. Musculoskeletal: Full range of motion to all extremities. No gross deformities appreciated. Genitourinary: RN present for pelvic exam.  No external rashes or lesions seen.  Blood in vaginal canal.  No cervical motion tenderness. Neurologic:  Normal speech and language. No gross focal neurologic deficits are appreciated.  Skin:   Skin is warm, dry and intact. No rash noted.   ____________________________________________   LABS (all labs ordered are listed, but only abnormal results are displayed)  Labs Reviewed  WET PREP, GENITAL - Abnormal; Notable for the following components:      Result Value   Trich, Wet Prep PRESENT (*)    All other components within normal limits  URINALYSIS, COMPLETE (UACMP) WITH MICROSCOPIC - Abnormal; Notable for the following components:   Color, Urine RED (*)    APPearance CLOUDY (*)    Hgb urine dipstick LARGE (*)    Protein, ur 100 (*)    Leukocytes, UA TRACE (*)    Squamous Epithelial / LPF 6-30 (*)    All other components within normal limits  CHLAMYDIA/NGC RT PCR ([facility] ONLY)  POC URINE PREG, ED  POCT PREGNANCY, URINE   ____________________________________________  EKG   ____________________________________________  RADIOLOGY   No results found.  ____________________________________________    PROCEDURES  Procedure(s) performed:    Procedures    Medications  metroNIDAZOLE (FLAGYL) tablet 2,000 mg (2,000 mg Oral Given 12/31/17 1124)     ____________________________________________   INITIAL IMPRESSION / ASSESSMENT AND PLAN / ED COURSE  Pertinent labs & imaging results that were available during my care of the patient were reviewed by me and considered in my medical decision making (see chart for details).  Review of the [place] CSRS was performed in accordance of the [facility] prior to dispensing any controlled drugs.   Patient's diagnosis is consistent with  UTI and trichomonas.  Vital signs and exam are reassuring.  Urinalysis consistent with infection.  Blood on urinalysis is likely since patient is on her menstrual cycle.  No abdominal or back pain. Wet prep consistent with trichomonas.  Gonorrhea and Chlamydia are negative.  Patient was educated about STDs.  She was given metronidazole in ED.  Patient will be discharged home with prescriptions  for Bactrim. Patient is to follow up with PCP and health department as directed. Patient is given ED precautions to return to the ED for any worsening or new symptoms.     ____________________________________________  FINAL CLINICAL IMPRESSION(S) / ED DIAGNOSES  Final diagnoses:  Trichimoniasis  Lower urinary tract infectious disease      NEW MEDICATIONS STARTED DURING THIS VISIT:  ED Discharge Orders        Ordered    sulfamethoxazole-trimethoprim (BACTRIM DS,SEPTRA DS) 800-160 MG tablet  2 times daily     12/31/17 1114          This chart was dictated using voice recognition software/Dragon. Despite best efforts to proofread, errors can occur which can change the meaning. Any change was purely unintentional.    Enid DerryWagner, Shela Esses, PA-C 12/31/17 1346    Jene EveryKinner, Robert, MD 12/31/17 [phone]

## 2017-12-31 NOTE — ED Notes (Signed)
Pelvic exam completed by PA with this RN present.

## 2019-07-10 IMAGING — DX DG CHEST 1V
1 series · 1 of 1 positions shown · non-contrast
Comparison: None

CLINICAL DATA: Generalized body pain.  Chest discomfort

EXAM:
CHEST 1 VIEW

[chest ap]
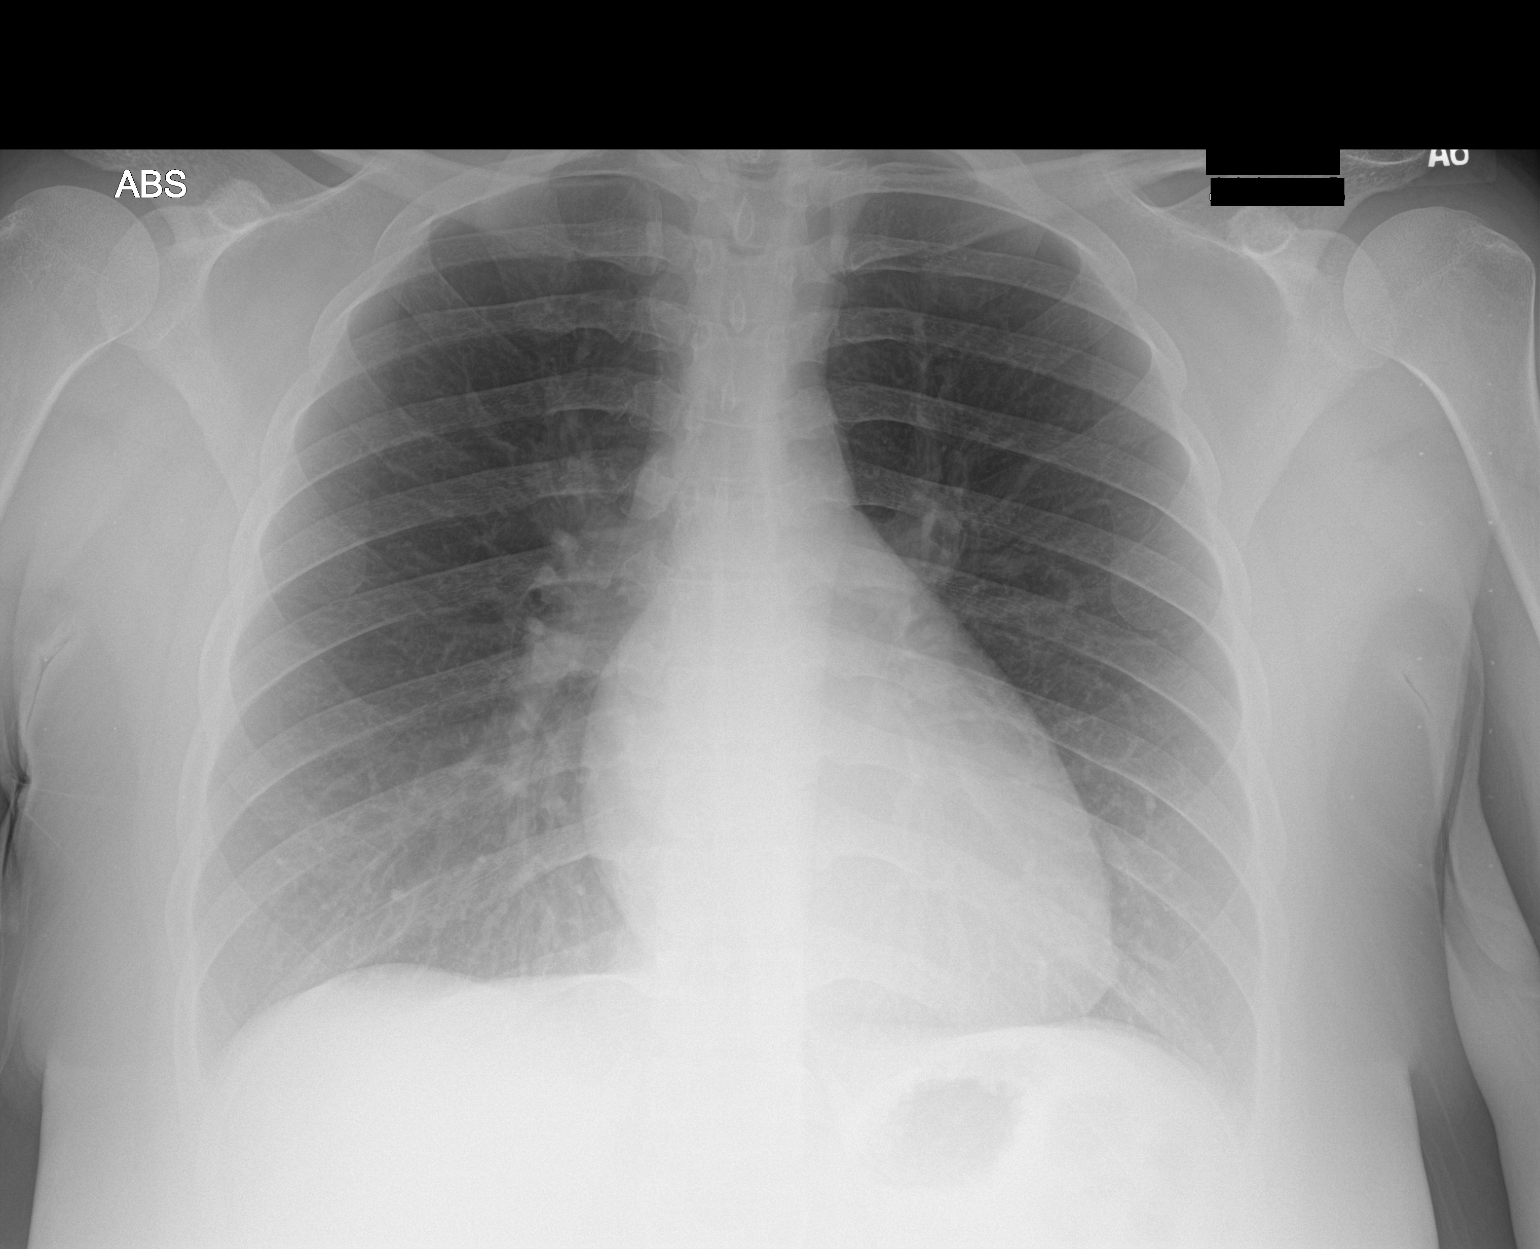

[1 of 1 positions shown; findings below may reference images not displayed]

FINDINGS: The heart size and mediastinal contours are within normal limits.
Both lungs are clear. The visualized skeletal structures are
unremarkable.
IMPRESSION: No active disease.

## 2021-10-02 ENCOUNTER — Other Ambulatory Visit: Payer: Self-pay

## 2021-10-02 ENCOUNTER — Emergency Department (HOSPITAL_BASED_OUTPATIENT_CLINIC_OR_DEPARTMENT_OTHER)
Admission: EM | Admit: 2021-10-02 | Discharge: 2021-10-02 | Disposition: A | Discharge status: Home / Self Care | Payer: Medicaid Other | Attending: Emergency Medicine | Admitting: Emergency Medicine

## 2021-10-02 ENCOUNTER — Encounter (HOSPITAL_BASED_OUTPATIENT_CLINIC_OR_DEPARTMENT_OTHER): Payer: Self-pay

## 2021-10-02 DIAGNOSIS — H60502 Unspecified acute noninfective otitis externa, left ear: Secondary | ICD-10-CM | POA: Diagnosis not present

## 2021-10-02 DIAGNOSIS — H9202 Otalgia, left ear: Secondary | ICD-10-CM | POA: Diagnosis present

## 2021-10-02 MED ORDER — NEOMYCIN-POLYMYXIN-HC 3.5-10000-1 OT SUSP: 4.0000 [drp] | Freq: Three times a day (TID) | OTIC | 0 refills | Status: AC

## 2021-10-02 MED ORDER — IBUPROFEN 600 MG PO TABS: 600.0000 mg | ORAL_TABLET | Freq: Four times a day (QID) | ORAL | 0 refills | Status: AC | PRN

## 2021-10-02 NOTE — Discharge Instructions (Addendum)
You have an Allergan traction on your left side.  Information about this is attached to these papers.  Please read these.  I have also sent antibiotics and ibuprofen to your pharmacy to take.  The antibiotic drops should be used for 10 days.  Return to the emergency department if you have worsening symptoms, began to have a headache, difficulty opening your mouth or discharge from your ears.

## 2021-10-02 NOTE — ED Provider Notes (Signed)
[facility] EMERGENCY DEPT Provider Note   CSN: [id number] Arrival date & time: 10/02/21  1754     History Chief Complaint  Patient presents with   Ear Pain    Tamara Perkins is a 25 y.o. female presenting with a complaint of left ear pain.  Reports this has been going on for 1 week.  Pain when she is laying on the left side, and tender to the touch all on the outside of the ear.  No drainage.  No fevers or chills.  No sore throat.  No symptoms on the right side.  Denies frequent swimming, airplane travel, changes in hair products or recurrent ear infections.   History reviewed. No pertinent past medical history.  Patient Active Problem List   Diagnosis Date Noted   Chlamydia 11/23/2015   Exposure to STD 09/18/2013   Obesity, unspecified 09/18/2013    Past Surgical History:  Procedure Laterality Date   LAPAROSCOPIC APPENDECTOMY N/A 06/22/2015   Procedure: APPENDECTOMY LAPAROSCOPIC;  Surgeon: Harriette Bouillon, MD;  Location: [facility] OR;  Service: General;  Laterality: N/A;     OB History     Gravida  1   Para  1   Term  1   Preterm      AB      Living  1      SAB      IAB      Ectopic      Multiple      Live Births  1           No family history on file.  Social History   Tobacco Use   Smoking status: Never   Smokeless tobacco: Never  Substance Use Topics   Alcohol use: Yes    Comment: occ   Drug use: No    Home Medications Prior to Admission medications   Medication Sig Start Date End Date Taking? Authorizing Provider  azithromycin (ZITHROMAX) 500 MG tablet Take 2 tablets (1,000 mg total) by mouth once. 11/23/15   Wouk, Wilfred Curtis, MD  brompheniramine-pseudoephedrine-DM 30-2-10 MG/5ML syrup Take 5 mLs by mouth 4 (four) times daily as needed. 12/13/17   Enid Derry, PA-C  levonorgestrel (MIRENA) 20 MCG/24HR IUD 1 each by Intrauterine route once.    [provider]  lidocaine (XYLOCAINE) 2 % solution Use as directed 10 mLs  in the mouth or throat as needed for mouth pain. 12/13/17   Enid Derry, PA-C  Prenatal Multivit-Min-Fe-FA (PRENATAL VITAMINS) 0.8 MG tablet Take 1 tablet by mouth daily. 11/20/15   Wouk, Wilfred Curtis, MD  sulfamethoxazole-trimethoprim (BACTRIM DS,SEPTRA DS) 800-160 MG tablet Take 1 tablet by mouth 2 (two) times daily. 12/31/17   Enid Derry, PA-C    Allergies    Penicillins  Review of Systems   Review of Systems  HENT:  Positive for ear pain. Negative for ear discharge, facial swelling, hearing loss and sore throat.   All other systems reviewed and are negative.  Physical Exam Updated Vital Signs BP 137/76 (BP Location: Right Arm)    Pulse 73    Temp 97.8 F (36.6 C) (Oral)    Resp 16    Ht 5\' 6"  (1.676 m)    Wt 98.9 kg    SpO2 99%    BMI 35.19 kg/m   Physical Exam Vitals and nursing note reviewed.  Constitutional:      Appearance: Normal appearance.  HENT:     Head: Normocephalic and atraumatic.     Right Ear: Tympanic membrane,  ear canal and external ear normal.     Left Ear: Tympanic membrane normal.     Ears:     Comments: TM visualized and normal, skin flaking and canal and large amounts of tragal and external ear tenderness.  No discharge or TM rupture.    Nose: No congestion.  Eyes:     General: No scleral icterus.    Conjunctiva/sclera: Conjunctivae normal.  Pulmonary:     Effort: Pulmonary effort is normal. No respiratory distress.  Skin:    Findings: No rash.  Neurological:     Mental Status: She is alert.  Psychiatric:        Mood and Affect: Mood normal.    ED Results / Procedures / Treatments   Labs (all labs ordered are listed, but only abnormal results are displayed) Labs Reviewed - No data to display  EKG None  Radiology No results found.  Procedures Procedures   Medications Ordered in ED Medications - No data to display  ED Course  I have reviewed the triage vital signs and the nursing notes.  Pertinent labs & imaging results that  were available during my care of the patient were reviewed by me and considered in my medical decision making (see chart for details).    MDM Rules/Calculators/A&P Patient fully evaluated by me, tenderness to external left ear.  TM visualized.  Skin flaking also present.  She did have some mastoid tenderness however no inflammation or erythema.  There is no discharge from the ear.  No fevers.  Low suspicion for mastoiditis.  Antibiotic drops have been sent to her pharmacy as well as ibuprofen for pain control.  She will use these as prescribed and return as discussed and attached to her discharge papers.  Final Clinical Impression(s) / ED Diagnoses Final diagnoses:  Acute otitis externa of left ear, unspecified type    Rx / DC Orders Results and diagnoses were explained to the patient. Return precautions discussed in full. Patient had no additional questions and expressed complete understanding.     Woodroe Chen 10/02/21 1950    Jacalyn Lefevre, MD 10/02/21 [phone]

## 2021-10-02 NOTE — ED Triage Notes (Signed)
Patient here POV from Home with Ear Pain.  Left Ear began feeling painful approximately 1 week PTA. No Drainage. No Fevers.   Ambulatory. NAD Noted during Triage. A&Ox4. GCS 15.

## 2022-02-14 ENCOUNTER — Encounter (HOSPITAL_COMMUNITY): Payer: Self-pay | Admitting: *Deleted

## 2022-02-14 ENCOUNTER — Ambulatory Visit (HOSPITAL_COMMUNITY)
Admission: EM | Admit: 2022-02-14 | Discharge: 2022-02-14 | Disposition: A | Discharge status: Home / Self Care | Payer: Medicaid Other | Attending: Emergency Medicine | Admitting: Emergency Medicine

## 2022-02-14 DIAGNOSIS — R42 Dizziness and giddiness: Secondary | ICD-10-CM

## 2022-02-14 DIAGNOSIS — R0789 Other chest pain: Secondary | ICD-10-CM | POA: Diagnosis not present

## 2022-02-14 MED ORDER — MECLIZINE HCL 12.5 MG PO TABS: 12.5000 mg | ORAL_TABLET | Freq: Three times a day (TID) | ORAL | 0 refills | Status: AC | PRN

## 2022-02-14 MED ORDER — PREDNISONE 20 MG PO TABS
40.0000 mg | ORAL_TABLET | Freq: Once | ORAL | Status: AC
  Administered 2022-02-14: 40 mg via ORAL

## 2022-02-14 MED ORDER — PREDNISONE 20 MG PO TABS: 40.0000 mg | ORAL_TABLET | Freq: Every day | ORAL | 0 refills | Status: DC

## 2022-02-14 MED ORDER — PREDNISONE 20 MG PO TABS
ORAL_TABLET | ORAL | Status: AC
  Filled 2022-02-14: qty 2

## 2022-02-14 NOTE — ED Triage Notes (Signed)
C/O intermittent mid-upper chest "tightness" onset 2 days ago, associated with HA and lightheadedness. When tightness occurs, states lasts approx 3 min; states tightness worsening "if I go to sit down". Denies any chest tightness at present, only c/o HA at this time. ?

## 2022-02-14 NOTE — Discharge Instructions (Signed)
At this time the cause of your symptoms are unknown, you were heart is beating in a regular pace and rhythm and that can be shown on the EKG strip as well, your vital signs are stable and there are no abnormalities in your neurological exam ? ?If your symptoms continue to persist you will need additional work-up to determine cause, a PCP referral has been made for you, and our nurses have attempted to set you up an outpatient appointment as well ? ?At any point if your symptoms worsen please go to the nearest emergency department for further evaluation and management ? ?At this time we will attempt to treat this symptoms, we will trial a short course of steroids, starting tomorrow begin use of prednisone every morning with food for 5 days, this medication may help with your headache as well and you have been given a dose in the office today ? ?The dizziness she may use meclizine every 8 hours (up to 3 times a day) for additional management ? ?Over the next day please attempt to rest, avoid caffeine and participate in low stimulation activities with minimal noise and lighting ? ?You have been given information for our cardiologist group, you may call them tomorrow to set up an appointment for further evaluation of your heart ?

## 2022-02-14 NOTE — ED Provider Notes (Signed)
?[facility] ? ? ? ?CSN: [id number] ?Arrival date & time: 02/14/22  1006 ? ? ?  ? ?History   ?Chief Complaint ?Chief Complaint  ?Patient presents with  ? Chest Pain  ? Dizziness  ? ? ?HPI ?Tamara Perkins is a 26 y.o. female.  ? ?This was centralized chest tightness causing intermittent dizziness and a intermittent generalized headache for 2 days.  Chest discomfort does not radiate.  Dizziness is described as the room spinning worsened by standing.  Headache is generalized with associated blurred vision, photophobia and phonophobia.  Symptoms started abruptly without precipitating event or accompanying illness.  Denies nausea, vomiting, abdominal pain, shortness of breath, wheezing, cough, memory or speech changes, weakness, syncope.  Denies cardiac or respiratory history.  Has attempted use of Tylenol and Excedrin for management of headache which has been ineffective.  ? ?History reviewed. No pertinent past medical history. ? ?Patient Active Problem List  ? Diagnosis Date Noted  ? Chlamydia 11/23/2015  ? Exposure to STD 09/18/2013  ? Obesity, unspecified 09/18/2013  ? ? ?Past Surgical History:  ?Procedure Laterality Date  ? LAPAROSCOPIC APPENDECTOMY N/A 06/22/2015  ? Procedure: APPENDECTOMY LAPAROSCOPIC;  Surgeon: Harriette Bouillonhomas Cornett, MD;  Location: [facility];  Service: General;  Laterality: N/A;  ? TUBAL LIGATION    ? ? ?OB History   ? ? Gravida  ?1  ? Para  ?1  ? Term  ?1  ? Preterm  ?   ? AB  ?   ? Living  ?1  ?  ? ? SAB  ?   ? IAB  ?   ? Ectopic  ?   ? Multiple  ?   ? Live Births  ?1  ?   ?  ?  ? ? ? ?Home Medications   ? ?Prior to Admission medications   ?Medication Sig Start Date End Date Taking? Authorizing Provider  ?azithromycin (ZITHROMAX) 500 MG tablet Take 2 tablets (1,000 mg total) by mouth once. 11/23/15   Wouk, Wilfred CurtisNoah Bedford, MD  ?brompheniramine-pseudoephedrine-DM 30-2-10 MG/5ML syrup Take 5 mLs by mouth 4 (four) times daily as needed. 12/13/17   Enid DerryWagner, Ashley, PA-C  ?ibuprofen (ADVIL) 600 MG tablet Take 1  tablet (600 mg total) by mouth every 6 (six) hours as needed. 10/02/21   Redwine, Madison A, PA-C  ?levonorgestrel (MIRENA) 20 MCG/24HR IUD 1 each by Intrauterine route once.    [provider]  ?lidocaine (XYLOCAINE) 2 % solution Use as directed 10 mLs in the mouth or throat as needed for mouth pain. 12/13/17   Enid DerryWagner, Ashley, PA-C  ?Prenatal Multivit-Min-Fe-FA (PRENATAL VITAMINS) 0.8 MG tablet Take 1 tablet by mouth daily. 11/20/15   Wouk, Wilfred CurtisNoah Bedford, MD  ?sulfamethoxazole-trimethoprim (BACTRIM DS,SEPTRA DS) 800-160 MG tablet Take 1 tablet by mouth 2 (two) times daily. 12/31/17   Enid DerryWagner, Ashley, PA-C  ? ? ?Family History ?Family History  ?Problem Relation Age of Onset  ? Healthy Mother   ? ? ?Social History ?Social History  ? ?Tobacco Use  ? Smoking status: Never  ? Smokeless tobacco: Never  ?Vaping Use  ? Vaping Use: Some days  ?Substance Use Topics  ? Alcohol use: Yes  ?  Comment: occasional wine  ? Drug use: No  ? ? ? ?Allergies   ?Ibuprofen and Penicillins ? ? ?Review of Systems ?Review of Systems  ?Constitutional: Negative.   ?Respiratory: Negative.    ?Cardiovascular:  Positive for chest pain and palpitations. Negative for leg swelling.  ?Gastrointestinal: Negative.   ?Skin: Negative.   ?  Neurological:  Positive for dizziness, light-headedness and headaches. Negative for tremors, seizures, syncope, facial asymmetry, speech difficulty, weakness and numbness.  ? ? ?Physical Exam ?Triage Vital Signs ?ED Triage Vitals  ?Enc Vitals Group  ?   BP 02/14/22 1053 123/72  ?   Pulse Rate 02/14/22 1053 62  ?   Resp 02/14/22 1053 16  ?   Temp 02/14/22 1053 98.2 ?F (36.8 ?C)  ?   Temp Source 02/14/22 1053 Oral  ?   SpO2 02/14/22 1053 99 %  ?   Weight --   ?   Height --   ?   Head Circumference --   ?   Peak Flow --   ?   Pain Score 02/14/22 1054 8  ?   Pain Loc --   ?   Pain Edu? --   ?   Excl. in GC? --   ? ?No data found. ? ?Updated Vital Signs ?BP 123/72   Pulse 62   Temp 98.2 ?F (36.8 ?C) (Oral)   Resp 16    LMP 02/10/2022 (Approximate)   SpO2 99%  ? ?Visual Acuity ?Right Eye Distance:   ?Left Eye Distance:   ?Bilateral Distance:   ? ?Right Eye Near:   ?Left Eye Near:    ?Bilateral Near:    ? ?Physical Exam ?Constitutional:   ?   Appearance: Normal appearance. She is well-developed.  ?HENT:  ?   Head: Normocephalic.  ?Eyes:  ?   Extraocular Movements: Extraocular movements intact.  ?Cardiovascular:  ?   Rate and Rhythm: Normal rate and regular rhythm.  ?   Pulses: Normal pulses.  ?   Heart sounds: Normal heart sounds.  ?Pulmonary:  ?   Effort: Pulmonary effort is normal.  ?   Breath sounds: Normal breath sounds.  ?Skin: ?   General: Skin is warm and dry.  ?Neurological:  ?   General: No focal deficit present.  ?   Mental Status: She is alert and oriented to person, place, and time. Mental status is at baseline.  ?Psychiatric:     ?   Mood and Affect: Mood normal.     ?   Behavior: Behavior normal.  ? ? ? ?UC Treatments / Results  ?Labs ?(all labs ordered are listed, but only abnormal results are displayed) ?Labs Reviewed - No data to display ? ?EKG ? ? ?Radiology ?No results found. ? ?Procedures ?Procedures (including critical care time) ? ?Medications Ordered in UC ?Medications - No data to display ? ?Initial Impression / Assessment and Plan / UC Course  ?I have reviewed the triage vital signs and the nursing notes. ? ?Pertinent labs & imaging results that were available during my care of the patient were reviewed by me and considered in my medical decision making (see chart for details). ? ?Chest tightness ?Dizziness ? ?Vital signs are stable, patient does not appear to be in any signs of distress, EKG showing normal sinus rhythm, S1 and S2 heard to auscultation, no abnormalities noted on the neurological exam, unknown etiology of symptoms at this time, discussed with patient, low suspicion of cardiac origin, will not defer to emergency room at this time, will move forward with attempting to minimize symptoms,  allergy to NSAIDs confirmed, declined injection of steroids or Imitrex in the office, recommended continued use of Tylenol outpatient, prednisone 40 mg burst prescribed as a trial as well as meclizine for management of dizziness, PCP referral placed as well is given a walking referral to heart  care if symptoms persist, given strict precautions that at any point if symptoms worsen she is to go to the nearest emergency department for further evaluation, verbalized understanding, work note given ?Final Clinical Impressions(s) / UC Diagnoses  ? ?Final diagnoses:  ?None  ? ?Discharge Instructions   ?None ?  ? ?ED Prescriptions   ?None ?  ? ?PDMP not reviewed this encounter. ?  ?Valinda Hoar, NP ?02/14/22 1145 ? ?

## 2022-02-19 ENCOUNTER — Encounter (HOSPITAL_COMMUNITY): Payer: Self-pay

## 2023-06-13 ENCOUNTER — Ambulatory Visit (HOSPITAL_COMMUNITY)
Admission: EM | Admit: 2023-06-13 | Discharge: 2023-06-13 | Disposition: A | Discharge status: Home / Self Care | Payer: Self-pay | Attending: Nurse Practitioner | Admitting: Nurse Practitioner

## 2023-06-13 ENCOUNTER — Encounter (HOSPITAL_COMMUNITY): Payer: Self-pay | Admitting: *Deleted

## 2023-06-13 DIAGNOSIS — Z1152 Encounter for screening for COVID-19: Secondary | ICD-10-CM | POA: Insufficient documentation

## 2023-06-13 DIAGNOSIS — J069 Acute upper respiratory infection, unspecified: Secondary | ICD-10-CM | POA: Insufficient documentation

## 2023-06-13 MED ORDER — PROMETHAZINE-DM 6.25-15 MG/5ML PO SYRP: 5.0000 mL | ORAL_SOLUTION | Freq: Every evening | ORAL | 0 refills | Status: AC | PRN

## 2023-06-13 MED ORDER — BENZONATATE 100 MG PO CAPS: 100.0000 mg | ORAL_CAPSULE | Freq: Three times a day (TID) | ORAL | 0 refills | Status: AC | PRN

## 2023-06-13 NOTE — ED Triage Notes (Signed)
Pt states she started with congestion, cough, headache, body aches, and dizziness on Saturday. She has only taken tylenol as needed. She states she took a COVID test Saturday it was neg.

## 2023-06-13 NOTE — ED Provider Notes (Signed)
[facility]    CSN: [id number] Arrival date & time: 06/13/23  1750      History   Chief Complaint Chief Complaint  Patient presents with   Nasal Congestion   Cough   Generalized Body Aches    HPI Tamara Perkins is a 27 y.o. female.   Patient presents today with 2-day history of bodyaches, dry cough, shortness of breath, runny and stuffy nose, headache, lightheadedness/dizziness, and fatigue.  She denies fever, chills, congestion in her chest, chest pain, chest tightness, sore throat, abdominal pain, nausea/vomiting, and diarrhea.  No loss of appetite or loss of taste/smell.  Reports fianc was sick with similar symptoms a couple of days ago and also she is exposed to somebody with COVID-19 at work she thinks.  Has taken Tylenol which does help with the body aches a little bit.  Last menstrual period approximately 3 weeks ago; patient has had tubal ligation is confident she is not pregnant.    History reviewed. No pertinent past medical history.  Patient Active Problem List   Diagnosis Date Noted   Chlamydia 11/23/2015   Exposure to STD 09/18/2013   Obesity, unspecified 09/18/2013    Past Surgical History:  Procedure Laterality Date   LAPAROSCOPIC APPENDECTOMY N/A 06/22/2015   Procedure: APPENDECTOMY LAPAROSCOPIC;  Surgeon: Harriette Bouillon, MD;  Location: [facility];  Service: General;  Laterality: N/A;   TUBAL LIGATION      OB History     Gravida  1   Para  1   Term  1   Preterm      AB      Living  1      SAB      IAB      Ectopic      Multiple      Live Births  1            Home Medications    Prior to Admission medications   Medication Sig Start Date End Date Taking? Authorizing Provider  benzonatate (TESSALON) 100 MG capsule Take 1 capsule (100 mg total) by mouth 3 (three) times daily as needed for cough. Do not take with alcohol or while driving or operating heavy machinery.  May cause drowsiness. 06/13/23  Yes Valentino Nose,  NP  promethazine-dextromethorphan (PROMETHAZINE-DM) 6.25-15 MG/5ML syrup Take 5 mLs by mouth at bedtime as needed for cough. Do not take with alcohol or while driving or operating heavy machinery.  May cause drowsiness. 06/13/23  Yes Cathlean Marseilles A, NP  ibuprofen (ADVIL) 600 MG tablet Take 1 tablet (600 mg total) by mouth every 6 (six) hours as needed. 10/02/21   Redwine, Madison A, PA-C  levonorgestrel (MIRENA) 20 MCG/24HR IUD 1 each by Intrauterine route once.    [provider]  lidocaine (XYLOCAINE) 2 % solution Use as directed 10 mLs in the mouth or throat as needed for mouth pain. 12/13/17   Enid Derry, PA-C  meclizine (ANTIVERT) 12.5 MG tablet Take 1 tablet (12.5 mg total) by mouth 3 (three) times daily as needed for dizziness. 02/14/22   White, Elita Boone, NP  Prenatal Multivit-Min-Fe-FA (PRENATAL VITAMINS) 0.8 MG tablet Take 1 tablet by mouth daily. 11/20/15   Wouk, Wilfred Curtis, MD    Family History Family History  Problem Relation Age of Onset   Healthy Mother     Social History Social History   Tobacco Use   Smoking status: Never   Smokeless tobacco: Never  Vaping Use   Vaping status: Some  Days  Substance Use Topics   Alcohol use: Yes    Comment: occasional wine   Drug use: No     Allergies   Ibuprofen and Penicillins   Review of Systems Review of Systems Per HPI  Physical Exam Triage Vital Signs ED Triage Vitals  Encounter Vitals Group     BP 06/13/23 1943 134/79     Systolic BP Percentile --      Diastolic BP Percentile --      Pulse Rate 06/13/23 1943 81     Resp 06/13/23 1943 18     Temp 06/13/23 1943 99.5 F (37.5 C)     Temp Source 06/13/23 1943 Oral     SpO2 06/13/23 1943 98 %     Weight --      Height --      Head Circumference --      Peak Flow --      Pain Score 06/13/23 1941 0     Pain Loc --      Pain Education --      Exclude from Growth Chart --    No data found.  Updated Vital Signs BP 134/79 (BP Location: Left  Arm)   Pulse 81   Temp 99.5 F (37.5 C) (Oral)   Resp 18   LMP  (LMP Unknown)   SpO2 98%   Visual Acuity Right Eye Distance:   Left Eye Distance:   Bilateral Distance:    Right Eye Near:   Left Eye Near:    Bilateral Near:     Physical Exam Vitals and nursing note reviewed.  Constitutional:      General: She is not in acute distress.    Appearance: Normal appearance. She is not ill-appearing or toxic-appearing.  HENT:     Head: Normocephalic and atraumatic.     Right Ear: Tympanic membrane, ear canal and external ear normal.     Left Ear: Tympanic membrane, ear canal and external ear normal.     Nose: Congestion present. No rhinorrhea.     Mouth/Throat:     Mouth: Mucous membranes are moist.     Pharynx: Oropharynx is clear. Posterior oropharyngeal erythema present. No oropharyngeal exudate.  Eyes:     General: No scleral icterus.    Extraocular Movements: Extraocular movements intact.  Cardiovascular:     Rate and Rhythm: Normal rate and regular rhythm.  Pulmonary:     Effort: Pulmonary effort is normal. No respiratory distress.     Breath sounds: Normal breath sounds. No wheezing, rhonchi or rales.  Musculoskeletal:     Cervical back: Normal range of motion and neck supple.  Lymphadenopathy:     Cervical: No cervical adenopathy.  Skin:    General: Skin is warm and dry.     Coloration: Skin is not jaundiced or pale.     Findings: No erythema or rash.  Neurological:     Mental Status: She is alert and oriented to person, place, and time.  Psychiatric:        Behavior: Behavior is cooperative.      UC Treatments / Results  Labs (all labs ordered are listed, but only abnormal results are displayed) Labs Reviewed  SARS CORONAVIRUS 2 (TAT 6-24 HRS)    EKG   Radiology No results found.  Procedures Procedures (including critical care time)  Medications Ordered in UC Medications - No data to display  Initial Impression / Assessment and Plan / UC  Course  I have reviewed the  triage vital signs and the nursing notes.  Pertinent labs & imaging results that were available during my care of the patient were reviewed by me and considered in my medical decision making (see chart for details).   Patient is well-appearing, normotensive, afebrile, not tachycardic, not tachypneic, oxygenating well on room air.    1. Viral URI with cough 2. Encounter for screening for COVID-19 Suspect viral etiology Vitals and exam today are reassuring COVID-19 testing obtained Supportive care discussed with patient Start Tessalon Perles and cough syrup ER and return precautions discussed Work excuse given  The patient was given the opportunity to ask questions.  All questions answered to their satisfaction.  The patient is in agreement to this plan.    Final Clinical Impressions(s) / UC Diagnoses   Final diagnoses:  Viral URI with cough  Encounter for screening for COVID-19     Discharge Instructions      You have a viral upper respiratory infection.  Symptoms should improve over the next week to 10 days.  If you develop chest pain or shortness of breath, go to the emergency room.  We have tested you today for COVID-19.  You will see the results in Mychart and we will call you with positive results.  Please stay home and isolate until you are aware of the results.    Some things that can make you feel better are: - Increased rest - Increasing fluid with water/sugar free electrolytes - Acetaminophen and ibuprofen as needed for fever/pain - Salt water gargling, chloraseptic spray and throat lozenges - OTC guaifenesin (Mucinex) 600 mg twice daily for congestion - Saline sinus flushes or a neti pot - Humidifying the air -Tessalon Perles every 8 hours as needed for dry cough and cough syrup at night time for dry cough     ED Prescriptions     Medication Sig Dispense Auth. Provider   benzonatate (TESSALON) 100 MG capsule Take 1 capsule (100 mg  total) by mouth 3 (three) times daily as needed for cough. Do not take with alcohol or while driving or operating heavy machinery.  May cause drowsiness. 21 capsule Cathlean Marseilles A, NP   promethazine-dextromethorphan (PROMETHAZINE-DM) 6.25-15 MG/5ML syrup Take 5 mLs by mouth at bedtime as needed for cough. Do not take with alcohol or while driving or operating heavy machinery.  May cause drowsiness. 118 mL Valentino Nose, NP      PDMP not reviewed this encounter.   Valentino Nose, NP 06/13/23 2012

## 2023-06-13 NOTE — Discharge Instructions (Signed)
You have a viral upper respiratory infection.  Symptoms should improve over the next week to 10 days.  If you develop chest pain or shortness of breath, go to the emergency room.  We have tested you today for COVID-19.  You will see the results in Mychart and we will call you with positive results.  Please stay home and isolate until you are aware of the results.    Some things that can make you feel better are: - Increased rest - Increasing fluid with water/sugar free electrolytes - Acetaminophen and ibuprofen as needed for fever/pain - Salt water gargling, chloraseptic spray and throat lozenges - OTC guaifenesin (Mucinex) 600 mg twice daily for congestion - Saline sinus flushes or a neti pot - Humidifying the air -Tessalon Perles every 8 hours as needed for dry cough and cough syrup at night time for dry cough

## 2023-06-14 LAB — SARS CORONAVIRUS 2 (TAT 6-24 HRS): SARS Coronavirus 2: POSITIVE — AB
